# Patient Record
Sex: Female | Born: 1955 | Race: White | Hispanic: No | Smoking: Former smoker
Health system: Southern US, Community
[De-identification: ages and names within clinical notes are randomized; demographics above are authoritative.]

## PROBLEM LIST (undated history)

## (undated) DIAGNOSIS — K219 Gastro-esophageal reflux disease without esophagitis: Secondary | ICD-10-CM

## (undated) DIAGNOSIS — F319 Bipolar disorder, unspecified: Secondary | ICD-10-CM

## (undated) DIAGNOSIS — G459 Transient cerebral ischemic attack, unspecified: Secondary | ICD-10-CM

## (undated) DIAGNOSIS — K5900 Constipation, unspecified: Secondary | ICD-10-CM

## (undated) DIAGNOSIS — B999 Unspecified infectious disease: Secondary | ICD-10-CM

## (undated) DIAGNOSIS — G4700 Insomnia, unspecified: Secondary | ICD-10-CM

## (undated) DIAGNOSIS — D649 Anemia, unspecified: Secondary | ICD-10-CM

## (undated) DIAGNOSIS — J45909 Unspecified asthma, uncomplicated: Secondary | ICD-10-CM

## (undated) DIAGNOSIS — M1990 Unspecified osteoarthritis, unspecified site: Secondary | ICD-10-CM

## (undated) DIAGNOSIS — F0390 Unspecified dementia without behavioral disturbance: Secondary | ICD-10-CM

## (undated) DIAGNOSIS — J449 Chronic obstructive pulmonary disease, unspecified: Secondary | ICD-10-CM

## (undated) DIAGNOSIS — E119 Type 2 diabetes mellitus without complications: Secondary | ICD-10-CM

## (undated) DIAGNOSIS — T50901A Poisoning by unspecified drugs, medicaments and biological substances, accidental (unintentional), initial encounter: Secondary | ICD-10-CM

## (undated) HISTORY — DX: Unspecified asthma, uncomplicated: J45.909

## (undated) HISTORY — DX: Bipolar disorder, unspecified: F31.9

## (undated) HISTORY — PX: WISDOM TOOTH EXTRACTION: SHX21

## (undated) HISTORY — DX: Gastro-esophageal reflux disease without esophagitis: K21.9

## (undated) HISTORY — DX: Anemia, unspecified: D64.9

## (undated) HISTORY — PX: HIP SURGERY: SHX245

## (undated) HISTORY — DX: Type 2 diabetes mellitus without complications: E11.9

## (undated) HISTORY — DX: Unspecified dementia, unspecified severity, without behavioral disturbance, psychotic disturbance, mood disturbance, and anxiety: F03.90

## (undated) HISTORY — DX: Chronic obstructive pulmonary disease, unspecified: J44.9

## (undated) HISTORY — DX: Insomnia, unspecified: G47.00

## (undated) HISTORY — DX: Constipation, unspecified: K59.00

## (undated) HISTORY — DX: Unspecified osteoarthritis, unspecified site: M19.90

## (undated) HISTORY — DX: Transient cerebral ischemic attack, unspecified: G45.9

---
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
## 1997-08-22 HISTORY — PX: ABDOMINAL HYSTERECTOMY: SHX81

## 2015-12-28 ENCOUNTER — Ambulatory Visit: Payer: Self-pay | Admitting: Neurology

## 2015-12-29 ENCOUNTER — Ambulatory Visit: Payer: Self-pay | Admitting: Neurology

## 2016-07-05 ENCOUNTER — Other Ambulatory Visit (INDEPENDENT_AMBULATORY_CARE_PROVIDER_SITE_OTHER): Payer: BLUE CROSS/BLUE SHIELD

## 2016-07-05 ENCOUNTER — Encounter: Payer: Self-pay | Admitting: Internal Medicine

## 2016-07-05 ENCOUNTER — Ambulatory Visit (INDEPENDENT_AMBULATORY_CARE_PROVIDER_SITE_OTHER): Payer: BLUE CROSS/BLUE SHIELD | Admitting: Internal Medicine

## 2016-07-05 VITALS — BP 124/80 | HR 81 | Ht 65.0 in | Wt 175.0 lb

## 2016-07-05 DIAGNOSIS — R05 Cough: Secondary | ICD-10-CM

## 2016-07-05 DIAGNOSIS — R059 Cough, unspecified: Secondary | ICD-10-CM

## 2016-07-05 DIAGNOSIS — R058 Other specified cough: Secondary | ICD-10-CM | POA: Insufficient documentation

## 2016-07-05 DIAGNOSIS — J449 Chronic obstructive pulmonary disease, unspecified: Secondary | ICD-10-CM

## 2016-07-05 LAB — CBC WITH DIFFERENTIAL/PLATELET
BASOS ABS: 0 10*3/uL (ref 0.0–0.1)
Basophils Relative: 0.6 % (ref 0.0–3.0)
EOS PCT: 1.1 % (ref 0.0–5.0)
Eosinophils Absolute: 0.1 10*3/uL (ref 0.0–0.7)
HCT: 39.8 % (ref 36.0–46.0)
Hemoglobin: 13.3 g/dL (ref 12.0–15.0)
LYMPHS ABS: 1.5 10*3/uL (ref 0.7–4.0)
Lymphocytes Relative: 17.2 % (ref 12.0–46.0)
MCHC: 33.5 g/dL (ref 30.0–36.0)
MCV: 95.2 fl (ref 78.0–100.0)
MONOS PCT: 8.3 % (ref 3.0–12.0)
Monocytes Absolute: 0.7 10*3/uL (ref 0.1–1.0)
NEUTROS ABS: 6.2 10*3/uL (ref 1.4–7.7)
Neutrophils Relative %: 72.8 % (ref 43.0–77.0)
PLATELETS: 334 10*3/uL (ref 150.0–400.0)
RBC: 4.18 Mil/uL (ref 3.87–5.11)
RDW: 12.7 % (ref 11.5–15.5)
WBC: 8.6 10*3/uL (ref 4.0–10.5)

## 2016-07-05 LAB — NITRIC OXIDE: Nitric Oxide: 37

## 2016-07-05 MED ORDER — PANTOPRAZOLE SODIUM 40 MG PO TBEC: 40.0000 mg | DELAYED_RELEASE_TABLET | Freq: Every day | ORAL | 2 refills | Status: DC

## 2016-07-05 MED ORDER — FAMOTIDINE 20 MG PO TABS: ORAL_TABLET | ORAL | Status: DC

## 2016-07-05 NOTE — Progress Notes (Signed)
Subjective:     Patient ID: Kimberly Ellison, female   DOB: [birth year],    MRN: [id number]  HPI  [id number] yowf quit smoking 04/04/16 with a h/o albuterol dependency for cough >sob dating  Back mid 90's and no better since quit smoking so referred to pulmonary clinic 07/05/2016 by Dr   Boneta LucksJennifer Brown    07/05/2016 1st [facility] office visit/ Kegan Mckeithan   Chief Complaint  Patient presents with  . Pulmonary Consult    Referred by Dr. Charlies SilversJennifer Couillard. Pt c/o cough x 4 months.    dry cough x 20 years [place] fall [place] and worse than usual cough / need for albuterol since arrival  Cough worse at hs/ prilosec 20 mg daily after bfast/ once gets to sleep less likely to cough or wake in am coughing.  using lots of cough  Drops Mostly sob when coughing   No obvious day to day or daytime variability or assoc excess/ purulent sputum or mucus plugs or hemoptysis or cp or chest tightness, subjective wheeze or overt sinus or hb symptoms. No unusual exp hx or h/o childhood pna/ asthma or knowledge of premature birth.  Sleeping ok without nocturnal  or early am exacerbation  of respiratory  c/o's or need for noct saba. Also denies any obvious fluctuation of symptoms with weather or environmental changes or other aggravating or alleviating factors except as outlined above   Current Medications, Allergies, Complete Past Medical History, Past Surgical History, Family History, and Social History were reviewed in [facility] electronic medical record.  ROS  The following are not active complaints unless bolded sore throat, dysphagia, dental problems, itching, sneezing,  nasal congestion or excess/ purulent secretions, ear ache,   fever, chills, sweats, unintended wt loss, classically pleuritic or exertional cp,  orthopnea pnd or leg swelling, presyncope, palpitations, abdominal pain, anorexia, nausea, vomiting, diarrhea  or change in bowel or bladder habits, change in stools or urine,  dysuria,hematuria,  rash, arthralgias, visual complaints, headache, numbness, weakness or ataxia or problems with walking or coordination,  change in mood/affect or memory.            Review of Systems     Objective:   Physical Exam    amb wf vigorous throat clearing / prominent pseudowheeze supine    Wt Readings from Last 3 Encounters:  07/05/16 175 lb (79.4 kg)    Vital signs reviewed    .HEENT: nl dentition, turbinates, and oropharynx. Nl external ear canals without cough reflex   NECK :  without JVD/Nodes/TM/ nl carotid upstrokes bilaterally   LUNGS: no acc muscle use,  Nl contour chest which is clear to A and P bilaterally without cough on insp or exp maneuvers   CV:  RRR  no s3 or murmur or increase in P2, no edema   ABD:  soft and nontender with nl inspiratory excursion in the supine position. No bruits or organomegaly, bowel sounds nl  MS:  Nl gait/ ext warm without deformities, calf tenderness, cyanosis or clubbing No obvious joint restrictions   SKIN: warm and dry without lesions    NEURO:  alert, approp, nl sensorium with  no motor deficits    cxr reported nl 03/2016   Labs ordered 07/05/2016   Allergy profile / cbc with diff    Assessment:

## 2016-07-05 NOTE — Patient Instructions (Addendum)
Please see patient coordinator before you leave today  to schedule sinus ct   Instead of prilosec change to Protonix (pantoprazole) Take 30-60 min before first meal of the day and Pepcid 20 mg one bedtime plus chlorpheniramine 4 mg x 2 at bedtime (both available over the counter)  until cough is completely gone for at least a week without the need for cough suppression  GERD (REFLUX)  is an extremely common cause of respiratory symptoms, many times with no significant heartburn at all.    It can be treated with medication, but also with lifestyle changes including avoidance of late meals, excessive alcohol, smoking cessation, and avoid fatty foods, chocolate, peppermint, colas, red wine, and acidic juices such as orange juice.  NO MINT OR MENTHOL PRODUCTS SO NO COUGH DROPS   USE HARD CANDY INSTEAD (jolley ranchers or Stover's or Lifesavers (all available in sugarless versions) NO OIL BASED VITAMINS - use powdered substitutes.  Please remember to go to the lab   department downstairs for your tests - we will call you with the results when they are available.  Please schedule a follow up office visit in 2 weeks, sooner if needed later with all active medications in hand

## 2016-07-06 DIAGNOSIS — J4530 Mild persistent asthma, uncomplicated: Secondary | ICD-10-CM | POA: Insufficient documentation

## 2016-07-06 LAB — RESPIRATORY ALLERGY PROFILE REGION II ~~LOC~~
Allergen, A. alternata, m6: <0.1 kU/L
Allergen, C. Herbarum, M2: <0.1 kU/L
Allergen, Cedar tree, t12: <0.1 kU/L
Allergen, Comm Silver Birch, t9: <0.1 kU/L
Allergen, Cottonwood, t14: <0.1 kU/L
Allergen, Mouse Urine Protein, e78: <0.1 kU/L
Allergen, Mulberry, t76: <0.1 kU/L
Allergen, P. notatum, m1: <0.1 kU/L
Aspergillus fumigatus, m3: <0.1 kU/L
Bermuda Grass: <0.1 kU/L
Box Elder IgE: <0.1 kU/L
Cockroach: <0.1 kU/L
Common Ragweed: <0.1 kU/L
IgE (Immunoglobulin E), Serum: 11 kU/L (ref <115)
Johnson Grass: <0.1 kU/L
Rough Pigweed  IgE: <0.1 kU/L

## 2016-07-06 NOTE — Assessment & Plan Note (Addendum)
Allergy profile 07/05/2016 >  Eos 0.1 /  IgE   - FENO 07/05/2016  =   37  - Sinus CT 07/11/16 >>>   This is Upper airway cough syndrome (previously labeled PNDS) , is  so named because it's frequently impossible to sort out how much is  CR/sinusitis with freq throat clearing (which can be related to primary GERD)   vs  causing  secondary (" extra esophageal")  GERD from wide swings in gastric pressure that occur with throat clearing, often  promoting self use of mint and menthol lozenges that reduce the lower esophageal sphincter tone and exacerbate the problem further in a cyclical fashion.   These are the same pts (now being labeled as having "irritable larynx syndrome" by some cough centers) who not infrequently have a history of having failed to tolerate ace inhibitors,  dry powder inhalers or biphosphonates or report having atypical/extraesophageal reflux symptoms that don't respond to standard doses of PPI  and are easily confused as having aecopd or asthma flares by even experienced allergists/ pulmonologists (myself included).   Of the three most common causes of chronic cough, only one (GERD)  can actually cause the other two (asthma and post nasal drip syndrome)  and perpetuate the cylce of cough inducing airway trauma, inflammation, heightened sensitivity to reflux which is prompted by the cough itself via a cyclical mechanism.    This may partially respond to steroids and look like asthma and post nasal drainage but never erradicated completely unless the cough and the secondary reflux are eliminated, preferably both at the same time.  While not intuitively obvious, many patients with chronic low grade reflux do not cough until there is a secondary insult that disturbs the protective epithelial barrier and exposes sensitive nerve endings.  This can be viral or direct physical injury such as with an endotracheal tube.   The point is that once this occurs, it is difficult to eliminate using  anything but a maximally effective acid suppression regimen at least in the short run, accompanied by an appropriate diet to address non acid GERD.   For now max rx for GERD / w/u for allergy/sinus ct and regroup in 2 weeks

## 2016-07-06 NOTE — Assessment & Plan Note (Signed)
Quit smoking 03/2016 - Spirometry 07/05/2016  FEV1 1.67 (62%)  Ratio 61 s rx day of ov and classic curvature   For now more bothered by cough than sob and explained to pt that with such severe uacs she's unlikely to tolerated dpi's and even laba/ics may be problematic so focus on cough control first then when returns will add low dose ics/laba   Total time devoted to counseling  = 35/3477m review case with pt/ discussion of options/alternatives/ personally creating written instructions  in presence of pt  then going over those specific  Instructions directly with the pt including how to use all of the meds but in particular covering each new medication in detail and the difference between the maintenance/automatic meds and the prns using an action plan format for the latter.

## 2016-07-07 NOTE — Progress Notes (Signed)
Spoke with pt and notified of results per Dr. Wert. Pt verbalized understanding and denied any questions. 

## 2016-07-11 ENCOUNTER — Ambulatory Visit (INDEPENDENT_AMBULATORY_CARE_PROVIDER_SITE_OTHER)
Admission: RE | Admit: 2016-07-11 | Discharge: 2016-07-11 | Disposition: A | Discharge status: Home / Self Care | Payer: BLUE CROSS/BLUE SHIELD | Source: Ambulatory Visit | Attending: Internal Medicine | Admitting: Internal Medicine

## 2016-07-11 DIAGNOSIS — R05 Cough: Secondary | ICD-10-CM | POA: Diagnosis not present

## 2016-07-11 DIAGNOSIS — R059 Cough, unspecified: Secondary | ICD-10-CM

## 2016-07-12 ENCOUNTER — Telehealth: Payer: Self-pay | Admitting: Internal Medicine

## 2016-07-12 NOTE — Telephone Encounter (Signed)
Patient called back - she can be reached at [phone]

## 2016-07-12 NOTE — Telephone Encounter (Signed)
Notes Recorded by Nyoka CowdenMichael B Wert, MD on 07/11/2016 at 5:27 PM EST Call patient : Study is unremarkable, no change in recs ------------------------ Spoke with pt, aware of results/recs.  Nothing further needed.

## 2016-07-26 ENCOUNTER — Ambulatory Visit (INDEPENDENT_AMBULATORY_CARE_PROVIDER_SITE_OTHER): Payer: BLUE CROSS/BLUE SHIELD | Admitting: Internal Medicine

## 2016-07-26 ENCOUNTER — Encounter: Payer: Self-pay | Admitting: Internal Medicine

## 2016-07-26 VITALS — BP 120/80 | HR 83 | Ht 65.0 in | Wt 181.0 lb

## 2016-07-26 DIAGNOSIS — J449 Chronic obstructive pulmonary disease, unspecified: Secondary | ICD-10-CM | POA: Diagnosis not present

## 2016-07-26 DIAGNOSIS — R05 Cough: Secondary | ICD-10-CM

## 2016-07-26 DIAGNOSIS — R059 Cough, unspecified: Secondary | ICD-10-CM

## 2016-07-26 MED ORDER — TIOTROPIUM BROMIDE MONOHYDRATE 1.25 MCG/ACT IN AERS: 2.0000 | INHALATION_SPRAY | Freq: Every day | RESPIRATORY_TRACT | 3 refills | Status: DC

## 2016-07-26 MED ORDER — BUDESONIDE-FORMOTEROL FUMARATE 80-4.5 MCG/ACT IN AERO: INHALATION_SPRAY | RESPIRATORY_TRACT | 11 refills | Status: DC

## 2016-07-26 NOTE — Patient Instructions (Signed)
Plan A = Automatic = Symbicort 80 (dulera 100) Take 2 puffs first thing in am and then another 2 puffs about 12 hours later.   Work on inhaler technique:  relax and gently blow all the way out then take a nice smooth deep breath back in, triggering the inhaler at same time you start breathing in.  Hold for up to 5 seconds if you can. Blow out thru nose. Rinse and gargle with water when done  Do not change the acid suppression for now   Plan B = Backup Only use your albuterol(proair)  as a rescue medication to be used if you can't catch your breath by resting or doing a relaxed purse lip breathing pattern.  - The less you use it, the better it will work when you need it. - Ok to use the inhaler up to 2 puffs  every 4 hours if you must but call for appointment if use goes up over your usual need - Don't leave home without it !!  (think of it like the spare tire for your car)   Please schedule a follow up office visit in 6 weeks, call sooner if needed

## 2016-07-26 NOTE — Progress Notes (Signed)
Subjective:     Patient ID: [place] A Weyenberg, female   DOB: [birth year],    MRN: [id number]    Brief patient profile:  60 yowf quit smoking 04/04/16 with a h/o albuterol dependency for cough >sob dating  Back mid 90's and no better since quit smoking so referred to pulmonary clinic 07/05/2016 by Dr   Boneta LucksJennifer Brown      History of Present Illness  07/05/2016 1st [facility] office visit/ Princella Jaskiewicz   Chief Complaint  Patient presents with  . Pulmonary Consult    Referred by Dr. Charlies SilversJennifer Couillard. Pt c/o cough x 4 months.    dry cough x 20 years moved [place] fall [place] and worse than usual cough / need for albuterol since arrival  Cough worse at hs/ prilosec 20 mg daily after bfast/ once gets to sleep less likely to cough or wake in am coughing.  using lots of cough  Drops Mostly sob when coughing  rec   schedule sinus ct > neg   Instead of prilosec change to Protonix (pantoprazole) Take 30-60 min before first meal of the day and Pepcid 20 mg one bedtime plus chlorpheniramine 4 mg x 2 at bedtime (both available over the counter)  until cough is completely gone for at least a week without the need for cough suppression GERD diet   Please schedule a follow up office visit in 2 weeks, sooner if needed later with all active medications in hand    07/26/2016  f/u ov/Nayra Coury re: ASTHMA since birth, 2 saba  inhalers per month on max gerd rx / no meds  Chief Complaint  Patient presents with  . Follow-up    2 week follow up. Pt. states the coughing pills are helping. She has not coughed in a month.    felt needed saba w/in one hour of ov   No obvious day to day or daytime variability or assoc excess/ purulent sputum or mucus plugs or hemoptysis or cp or chest tightness, subjective wheeze or overt sinus or hb symptoms. No unusual exp hx or h/o childhood pna/ asthma or knowledge of premature birth.  Sleeping ok without nocturnal  or early am exacerbation  of respiratory  c/o's or need  for noct saba. Also denies any obvious fluctuation of symptoms with weather or environmental changes or other aggravating or alleviating factors except as outlined above   Current Medications, Allergies, Complete Past Medical History, Past Surgical History, Family History, and Social History were reviewed in [facility] electronic medical record.  ROS  The following are not active complaints unless bolded sore throat, dysphagia, dental problems, itching, sneezing,  nasal congestion or excess/ purulent secretions, ear ache,   fever, chills, sweats, unintended wt loss, classically pleuritic or exertional cp,  orthopnea pnd or leg swelling, presyncope, palpitations, abdominal pain, anorexia, nausea, vomiting, diarrhea  or change in bowel or bladder habits, change in stools or urine, dysuria,hematuria,  rash, arthralgias, visual complaints, headache, numbness, weakness or ataxia or problems with walking or coordination,  change in mood/affect or memory.              Objective:   Physical Exam  amb wf nad    Wt Readings from Last 3 Encounters:  07/26/16 181 lb (82.1 kg)  07/05/16 175 lb (79.4 kg)    Vital signs reviewed - Note on arrival 02 sats  97% on RA     HEENT: nl   turbinates, and oropharynx. Nl external ear canals without  cough reflex - full dentures    NECK :  without JVD/Nodes/TM/ nl carotid upstrokes bilaterally   LUNGS: no acc muscle use,  Nl contour chest which is clear to A and P bilaterally without cough on insp or exp maneuvers   CV:  RRR  no s3 or murmur or increase in P2, no edema   ABD:  soft and nontender with nl inspiratory excursion in the supine position. No bruits or organomegaly, bowel sounds nl  MS:  Nl gait/ ext warm without deformities, calf tenderness, cyanosis or clubbing No obvious joint restrictions   SKIN: warm and dry without lesions    NEURO:  alert, approp, nl sensorium with  no motor deficits          Assessment:

## 2016-07-27 NOTE — Assessment & Plan Note (Signed)
Allergy profile 07/05/2016 >  Eos 0.1 /  IgE 11 neg RAST  - FENO 07/05/2016  =   37  - Sinus CT 07/11/2016  : Paranasal sinuses clear. -  07/26/2016  Improved on rx for cough   Adequate control on present rx, reviewed in detail with pt > no change in rx needed

## 2016-07-27 NOTE — Assessment & Plan Note (Addendum)
Quit smoking 03/2016 - Spirometry 07/05/2016  FEV1 1.67 (62%)  Ratio 61 s rx day of ov and classic curvature   - Spirometry 07/26/2016  FEV1 1.76 (65%)  Ratio 71 with minimal curvature    - The proper method of use, as well as anticipated side effects, of a metered-dose inhaler are discussed and demonstrated to the patient. Improved effectiveness after extensive coaching during this visit to a level of approximately 75 % from a baseline of 50 %     Cough is better with gerd rx but not perceived need for saba suggesting poor control of asthma  In this case Adherence is the biggest issue and starts with  inability to use HFA effectively and also  understand that SABA treats the symptoms but doesn't get to the underlying problem (inflammation).  I used  the analogy of putting steroid cream on a rash to help explain the meaning of topical therapy and the need to get the drug to the target tissue.    rec trial of symb 80 2bid and change saba to true rescue rx - avoid higher doses of ICS and all powders due to cough tendency   I had an extended discussion with the patient reviewing all relevant studies completed to date and  lasting 15 to 20 minutes of a 25 minute visit    Each maintenance medication was reviewed in detail including most importantly the difference between maintenance and prns and under what circumstances the prns are to be triggered using an action plan format that is not reflected in the computer generated alphabetically organized AVS.    Please see AVS for unique instructions that I personally wrote and verbalized to the the pt in detail and then reviewed with pt  by my nurse highlighting any  changes in therapy recommended at today's visit to their plan of care.

## 2016-09-06 ENCOUNTER — Ambulatory Visit (INDEPENDENT_AMBULATORY_CARE_PROVIDER_SITE_OTHER): Payer: BLUE CROSS/BLUE SHIELD | Admitting: Internal Medicine

## 2016-09-06 ENCOUNTER — Encounter: Payer: Self-pay | Admitting: Internal Medicine

## 2016-09-06 VITALS — BP 112/80 | HR 75 | Ht 65.0 in | Wt 183.9 lb

## 2016-09-06 DIAGNOSIS — R05 Cough: Secondary | ICD-10-CM

## 2016-09-06 DIAGNOSIS — R058 Other specified cough: Secondary | ICD-10-CM

## 2016-09-06 DIAGNOSIS — J449 Chronic obstructive pulmonary disease, unspecified: Secondary | ICD-10-CM | POA: Diagnosis not present

## 2016-09-06 MED ORDER — BUDESONIDE-FORMOTEROL FUMARATE 80-4.5 MCG/ACT IN AERO: 2.0000 | INHALATION_SPRAY | Freq: Two times a day (BID) | RESPIRATORY_TRACT | 0 refills | Status: DC

## 2016-09-06 MED ORDER — BUDESONIDE-FORMOTEROL FUMARATE 80-4.5 MCG/ACT IN AERO: 2.0000 | INHALATION_SPRAY | Freq: Two times a day (BID) | RESPIRATORY_TRACT | 11 refills | Status: DC

## 2016-09-06 NOTE — Patient Instructions (Addendum)
Plan A = Automatic = Symbicort 80 Take 2 puffs first thing in am and then another 2 puffs about 12 hours later.  Also continue protonix (pantoprazole ) 40 mg Take 30-60 min before first meal of the day or Prilosec over the counter plus pepcid 20 mg at bedtime (also over the counter  For drainage / throat tickle try take CHLORPHENIRAMINE  4 mg - take one every 4 hours as needed - available over the counter- may cause drowsiness so start with just a bedtime dose or two and see how you tolerate it before trying in daytime    Work on inhaler technique:  relax and gently blow all the way out then take a nice smooth deep breath back in, triggering the inhaler at same time you start breathing in.  Hold for up to 5 seconds if you can. Blow out thru nose. Rinse and gargle with water when done     Plan B = Backup Only use your albuterol (PROAIR) as a rescue medication to be used if you can't catch your breath by resting or doing a relaxed purse lip breathing pattern.  - The less you use it, the better it will work when you need it. - Ok to use the inhaler up to 2 puffs  every 4 hours if you must but call for appointment if use goes up over your usual need - Don't leave home without it !!  (think of it like the spare tire for your car)    Please schedule a follow up office visit in 6 weeks, call sooner if needed with pfts first  - don't use your proair w/in 4 hours of test and we will give it to you here

## 2016-09-06 NOTE — Progress Notes (Signed)
Subjective:     Patient ID: Kimberly Ellison, female   DOB: June 21, 1956,    MRN: [id number]    Brief patient profile:  60 yowf quit smoking 04/04/16 with a h/o albuterol dependency for cough >sob dating  Back mid 90's and no better since quit smoking so referred to pulmonary clinic 07/05/2016 by Dr   Boneta Lucks      History of Present Illness  07/05/2016 1st [facility] office visit/ Kimberly Ellison   Chief Complaint  Patient presents with  . Pulmonary Consult    Referred by Dr. Charlies Silvers. Pt c/o cough x 4 months.    dry cough x 20 years moved to [place] from [place] fall 2016 and worse than usual cough / need for albuterol since arrival  Cough worse at hs/ prilosec 20 mg daily after bfast/ once gets to sleep less likely to cough or wake in am coughing.  using lots of cough  Drops Mostly sob when coughing  rec   schedule sinus ct > neg   Instead of prilosec change to Protonix (pantoprazole) Take 30-60 min before first meal of the day and Pepcid 20 mg one bedtime plus chlorpheniramine 4 mg x 2 at bedtime (both available over the counter)  until cough is completely gone for at least a week without the need for cough suppression GERD diet  Please schedule a follow up office visit in 2 weeks, sooner if needed later with all active medications in hand     07/26/2016  f/u ov/Kimberly Ellison re: ASTHMA since birth, 2 saba  inhalers per month on max gerd rx / no meds in hand Chief Complaint  Patient presents with  . Follow-up    2 week follow up. Pt. states the coughing pills are helping. She has not coughed in a month.    felt needed saba w/in one hour of ov  rec Plan A = Automatic = Symbicort 80 (dulera 100) Take 2 puffs first thing in am and then another 2 puffs about 12 hours later.  Work on inhaler technique:  relax and gently blow all the way out then take a nice smooth deep breath back in, triggering the inhaler at same time you start breathing in.  Hold for up to 5 seconds if you can. Blow  out thru nose. Rinse and gargle with water when done Do not change the acid suppression for now  Plan B = Backup Only use your albuterol(proair)  as a rescue medication      09/06/2016  f/u ov/Kimberly Ellison re:   Copd vs asthma confused with meds / maintt symb 80 2bid/ ppi/ hs hs/ again did not bring meds as req Chief Complaint  Patient presents with  . Follow-up    Pt stated her COPD is stable. Pt stated her cough has improved drastically and is very minimal at this time   still overusing saba/ confused re purpose of symb 80/ really no significant cough since last ov thoroughly confused with instructions Symptoms are daytime only never noct   No obvious day to day or daytime variability or assoc excess/ purulent sputum or mucus plugs or hemoptysis or cp or chest tightness, subjective wheeze or overt sinus or hb symptoms. No unusual exp hx or h/o childhood pna/ asthma or knowledge of premature birth.  Sleeping ok flat  without nocturnal  or early am exacerbation  of respiratory  c/o's or need for noct saba. Also denies any obvious fluctuation of symptoms with weather or environmental changes or  other aggravating or alleviating factors except as outlined above   Current Medications, Allergies, Complete Past Medical History, Past Surgical History, Family History, and Social History were reviewed in [facility] electronic medical record.  ROS  The following are not active complaints unless bolded sore throat, dysphagia, dental problems, itching, sneezing,  nasal congestion or excess/ purulent secretions, ear ache,   fever, chills, sweats, unintended wt loss, classically pleuritic or exertional cp,  orthopnea pnd or leg swelling, presyncope, palpitations, abdominal pain, anorexia, nausea, vomiting, diarrhea  or change in bowel or bladder habits, change in stools or urine, dysuria,hematuria,  rash, arthralgias, visual complaints, headache, numbness, weakness or ataxia or problems with walking or  coordination,  change in mood/affect or memory.              Objective:   Physical Exam  amb wf nad rarely throat clearing     09/06/2016       183   07/26/16 181 lb (82.1 kg)  07/05/16 175 lb (79.4 kg)    Vital signs reviewed - Note on arrival 02 sats  98% on RA     HEENT: nl   turbinates, and oropharynx. Nl external ear canals without cough reflex - full dentures    NECK :  without JVD/Nodes/TM/ nl carotid upstrokes bilaterally   LUNGS: no acc muscle use,  Nl contour chest which is clear to A and P bilaterally without cough on insp or exp maneuvers   CV:  RRR  no s3 or murmur or increase in P2, no edema   ABD:  soft and nontender with nl inspiratory excursion in the supine position. No bruits or organomegaly, bowel sounds nl  MS:  Nl gait/ ext warm without deformities, calf tenderness, cyanosis or clubbing No obvious joint restrictions   SKIN: warm and dry without lesions    NEURO:  alert, approp, nl sensorium with  no motor deficits        Labs ordered 09/06/2016  Allergy profile   Assessment:

## 2016-09-07 NOTE — Assessment & Plan Note (Addendum)
Allergy profile 07/05/2016 >  Eos 0.1 /  IgE 11 neg RAST  - FENO 07/05/2016  =   37  - Sinus CT 07/11/2016  : Paranasal sinuses clear. -  07/26/2016  Improved on rx for cough      Upper airway cough syndrome (previously labeled PNDS) , is  so named because it's frequently impossible to sort out how much is  CR/sinusitis with freq throat clearing (which can be related to primary GERD)   vs  causing  secondary (" extra esophageal")  GERD from wide swings in gastric pressure that occur with throat clearing, often  promoting self use of mint and menthol lozenges that reduce the lower esophageal sphincter tone and exacerbate the problem further in a cyclical fashion.   These are the same pts (now being labeled as having "irritable larynx syndrome" by some cough centers) who not infrequently have a history of having failed to tolerate ace inhibitors,  dry powder inhalers or biphosphonates or report having atypical/extraesophageal reflux symptoms that don't respond to standard doses of PPI  and are easily confused as having aecopd or asthma flares by even experienced allergists/ pulmonologists (myself included).   This has improved on rx for gerd, still clearing throat though > reminded re use of 1st gen per guidelines. see avs for instructions unique to this ov

## 2016-09-07 NOTE — Assessment & Plan Note (Addendum)
Quit smoking 03/2016 - Spirometry 07/05/2016  FEV1 1.67 (62%)  Ratio 61 s rx day of ov and classic curvature   - Spirometry 07/26/2016  FEV1 1.76 (65%)  Ratio 71 with minimal curvature   - 07/26/2016  > try symbicort 80 2bid  - 09/06/2016  After extensive coaching HFA effectiveness =    90% > continue symb 80 2bid  Symptoms are markedly disproportionate to objective findings and not clear this is a lung problem but pt does appear to have difficult airway management issues. DDX of  difficult airways management almost all start with A and  include Adherence, Ace Inhibitors, Acid Reflux, Active Sinus Disease, Alpha 1 Antitripsin deficiency, Anxiety masquerading as Airways dz,  ABPA,  Allergy(esp in young), Aspiration (esp in elderly), Adverse effects of meds,  Active smokers, A bunch of PE's (a small clot burden can't cause this syndrome unless there is already severe underlying pulm or vascular dz with poor reserve) plus two Bs  = Bronchiectasis and Beta blocker use..and one C= CHF  Adherence is always the initial "prime suspect" and is a multilayered concern that requires a "trust but verify" approach in every patient - starting with knowing how to use medications, especially inhalers, correctly, keeping up with refills and understanding the fundamental difference between maintenance and prns vs those medications only taken for a very short course and then stopped and not refilled.  - thoroughly confused with meds- needs to return with all meds in hand using a trust but verify approach to confirm accurate Medication  Reconciliation The principal here is that until we are certain that the  patients are doing what we've asked, it makes no sense to ask them to do more.  - good hfa technique so continue symb hfa 80 2bid   ? Acid (or non-acid) GERD > always difficult to exclude as up to 75% of pts in some series report no assoc GI/ Heartburn symptoms> rec continue max (24h)  acid suppression and diet  restrictions/ reviewed     ? Anxiety/depression > usually at the bottom of this list of usual suspects but should be much higher on this pt's based on H and P and may be interfering with compliance issues / over use of saba    ? Allergy / asthma > neg feno and allergy profile/ neg assoc rhinitis or noct symptoms rules against, so no titration of ics needed  ? chf > no evidence to support and note absence of any noct component   I had an extended discussion with the patient reviewing all relevant studies completed to date and  lasting 15 to 20 minutes of a 25 minute visit    Each maintenance medication was reviewed in detail including most importantly the difference between maintenance and prns and under what circumstances the prns are to be triggered using an action plan format that is not reflected in the computer generated alphabetically organized AVS.    Please see AVS for specific instructions unique to this visit that I personally wrote and verbalized to the the pt in detail and then reviewed with pt  by my nurse highlighting any  changes in therapy recommended at today's visit to their plan of care.

## 2016-10-18 ENCOUNTER — Ambulatory Visit: Payer: BLUE CROSS/BLUE SHIELD | Admitting: Internal Medicine

## 2017-05-11 ENCOUNTER — Encounter: Payer: Self-pay | Admitting: *Deleted

## 2017-05-12 ENCOUNTER — Ambulatory Visit (INDEPENDENT_AMBULATORY_CARE_PROVIDER_SITE_OTHER): Payer: BLUE CROSS/BLUE SHIELD | Admitting: Neurology

## 2017-05-12 ENCOUNTER — Encounter: Payer: Self-pay | Admitting: Neurology

## 2017-05-12 ENCOUNTER — Encounter: Payer: Self-pay | Admitting: Psychology

## 2017-05-12 VITALS — BP 132/79 | HR 72 | Ht 64.5 in | Wt 181.0 lb

## 2017-05-12 DIAGNOSIS — R413 Other amnesia: Secondary | ICD-10-CM | POA: Diagnosis not present

## 2017-05-12 DIAGNOSIS — R419 Unspecified symptoms and signs involving cognitive functions and awareness: Secondary | ICD-10-CM

## 2017-05-12 NOTE — Progress Notes (Addendum)
[facility]    Provider:  Dr Lucia Gaskins Referring Provider: Charlies Silvers, Voula* Primary Care Physician:  Charlies Silvers, PA-C  CC:  Memory difficulty  HPI:  Kimberly Ellison is a 61 y.o. female here as a referral from Dr. Tereso Newcomer for memory problems. Past medical history of bipolar disorder on Lamictal, cocaine addiction, depression, diabetes, anxiety, insomnia, marijuana dependence, suicidal ideation, TIA, COPD, HLD, previous smoker. She is here with daughter who provides much information. She divorced 8 years ago and moved in with a relative. 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 Mother passed away 2 years ago and patient moved to this area, they had to have a calendar and keep notes (aunt helped patient). More short term memory, dates, names, recent conversations, repeats things she says and asks the same questions in the same day multiple times over and over in the same day. 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 She can't manage the printer or put paper in it, forgets daily. Slowly progresses but waxes and wanes as well. Patient forgot her son-in-law's birthday and had enough, thought it was time to be evaluated because everyone has told her for years that she is having memory problems. Father had dementia died at 22 and had dementia at least 6 years prior to death, he was a smoker but no known use of substance abuse in her family. Mother developed memory loss in her 4s. Patient has blank stares, alteration of awareness.  She does not respond initially. Patient is completely aware when she is staring per patient, she just doesn't doesn't answer. No hx of seizure. She is on medication for bipolar disorder and says she is well controlled but daughter believes she has more depression than she says she does, she has manic states and most recently this past July called daughter crying saying she couldn't handle things and there is major anxiety and stress ongoing.   Reviewed notes, labs and imaging from outside physicians, which showed:  Reviewed primary care notes and notes from [facility]. She presented with memory issues. Becoming more forgetful for patient. That short-term memory for several years per daughter. Daughter states patient is worried about getting lost in developing memory issues. She has to tell the patient several times how to put paper copy her work and had to do other things at work. Mostly short-term memory. Patient's mother and father both had dementia. Patient also gets a blank stare and her face and doesn't remember anything happening. She drinks 2-4 L of Diet Coke every day. She is treated by psychiatry for bipolar disorder. She performs all her own ADLs and drives just worried about getting lost. MMSE was 27 out of 30 05/09/2017.     Review of Systems: Patient complains of symptoms per HPI as well as the following symptoms: Increased thirst, joint pain, shortness of breath, cough, wheezing, snoring, hearing loss. Pertinent negatives and positives per HPI. All others negative.   Social History   Social History  . Marital status: Divorced    Spouse name:  N/A  . Number of children: 1  . Years of education: 10   Occupational History  .      [employer]   Social History Main Topics  . Smoking status: Former Smoker    Packs/day: 2.00    Years: 46.00    Types: Cigarettes    Quit date: 04/04/2016  . Smokeless tobacco: Never Used  . Alcohol use No  . Drug use: No     Comment: hx cocaine,  marijuana   . Sexual activity: Not on file   Other Topics Concern  . Not on file   Social History Narrative   Lives alone   Drinks 2-4 liters Diet Coke daily    Family History  Problem Relation Age of Onset  . Lymphoma Mother   . Hypertension Mother   . Stroke Mother   . Hypertension Sister   . Diabetes Sister   . Diabetes Daughter     Past Medical History:  Diagnosis Date  . Asthma   . Bipolar 1 disorder (HCC)   . COPD (chronic obstructive pulmonary disease) (HCC)   . Diabetes (HCC)   . DJD (degenerative joint disease)   . GERD (gastroesophageal reflux disease)   . Insomnia   . Transient cerebral ischemia     Past Surgical History:  Procedure Laterality Date  . ABDOMINAL HYSTERECTOMY  1999  . WISDOM TOOTH EXTRACTION      Current Outpatient Prescriptions  Medication Sig Dispense Refill  . aspirin EC 81 MG tablet Take 81 mg by mouth daily.    Marland Kitchen atorvastatin (LIPITOR) 10 MG tablet 10 mg daily.  0  . budesonide-formoterol (SYMBICORT) 80-4.5 MCG/ACT inhaler Inhale 2 puffs into the lungs 2 (two) times daily. 1 Inhaler 0  . IRON PO Take 1 tablet by mouth daily.    Marland Kitchen lamoTRIgine (LAMICTAL) 100 MG tablet Take 100 mg by mouth daily.    Marland Kitchen OLANZapine (ZYPREXA) 20 MG tablet Take 20 mg by mouth 2 (two) times daily.    . temazepam (RESTORIL) 30 MG capsule 30 mg at bedtime.  0   No current facility-administered medications for this visit.     Allergies as of 05/12/2017 - Review Complete 05/12/2017  Allergen Reaction Noted  . Penicillins Hives 07/05/2016    Vitals: BP 132/79   Pulse 72   Ht 5' 4.5" (1.638 m)   Wt 181 lb (82.1 kg)   BMI 30.59 kg/m  Last Weight:  Wt Readings from Last 1 Encounters:  05/12/17 181 lb (82.1 kg)   Last Height:   Ht Readings from Last 1 Encounters:  05/12/17 5' 4.5" (1.638 m)  Physical exam: Exam: Gen: NAD, conversant, well nourised, obese, well groomed                     CV: RRR, no MRG. No Carotid Bruits. No peripheral edema,  warm, nontender Eyes: Conjunctivae clear without exudates or hemorrhage  Neuro: Detailed Neurologic Exam  Speech:    Speech is normal; fluent and spontaneous with normal comprehension.  Cognition:  MMSE - Mini Mental State Exam 05/12/2017  Orientation to time 3  Orientation to Place 3  Registration 3  Attention/ Calculation 5  Recall 1  Language- name 2 objects 2  Language- repeat 0  Language- follow 3 step command 3  Language- read & follow direction 1  Write a sentence 1  Copy design 0  Total score 22      The patient is oriented to person, place,  and time;     recent and remote memory intact;     language fluent;     Impaired attention, concentration, fund of knowledge Cranial Nerves:    The pupils are equal, round, and reactive to light. Attempted fundoscopic exam could not visualize. . Visual fields are full to finger confrontation. Extraocular movements are intact. Trigeminal sensation is intact and the muscles of mastication are normal. The face is symmetric. The palate elevates in the midline. Hearing intact. Voice is normal. Shoulder shrug is normal. The tongue has normal motion without fasciculations.   Coordination:    Normal finger to nose and heel to shin. Normal rapid alternating movements.   Gait:    Heel-toe and tandem gait are normal.   Motor Observation:    No asymmetry, no atrophy, and no involuntary movements noted. Tone:    Normal muscle tone.    Posture:    Posture is normal. normal erect    Strength:    Strength is V/V in the upper and lower limbs.      Sensation: intact to LT     Reflex Exam:  DTR's:    Deep tendon reflexes in the upper and lower extremities are normal bilaterally.   Toes:    The toes are downgoing bilaterally.   Clonus:    Clonus is absent.        Assessment/Plan:  61 year old with memory loss, more short-term, progressive. FHx of dementia in both parents. Complicated by a history of substance abuse including  cocaine, marijuana, alcohol (15 years ago) and more recent medication abuse of Tramadol. No alcohol use, maybe an ocassional drink when they eat. No medication overuse for one year. She lives with daughter and works at [employer] so she is monitored. Last substance abuse was a year ago.  Also concern about altered consciousness due to staring spells, does not correlate with seizures but need to evaluate for seizure focus with MRI brain and EEG as weall as for memory problems to eval for reversible causes of symptoms. Also formal neurocognitive testing would be helpful here. Recommended f/u with psychiatry and surgery for psychiatric illnesses. Will check labs such as B12 and tsh as these can cause neuropsychiatric problems. Will check lamictal level for long term med use.  Patient is unable to drive, operate heavy machinery, perform activities at heights or participate in water activities until 6 months seizure free  Addendum: discussed lacunar infarct with patient and daughter. Discussed with patient and daughter. ASA  for secondary stroke prevention and maintain strict control of hypertension with blood pressure goal below 130/90, diabetes with hemoglobin A1c goal below 6.5% and lipids with LDL cholesterol goal below 70 mg/dL I also advised the patient to eat a healthy diet with plenty of whole grains, cereals, fruits and vegetables, exercise regularly and maintain ideal body weight .Followup in the future with me after formal neurocognitive testing and also ordered cognitive therapy.   Orders Placed This Encounter  Procedures  . MR BRAIN W WO CONTRAST  . B12 and Folate Panel  . Methylmalonic acid, serum  . RPR  . HIV antibody  . Homocysteine  . TSH  . Comprehensive metabolic panel  . CBC  . Lamotrigine level  . Ambulatory referral to Neuropsychology  . EEG   Cc: Couillard, Victorino Dike, PA-C  Naomie Dean, MD  [facility] [address] [place], [place] [address]  Phone [phone] Fax [phone]

## 2017-05-12 NOTE — Patient Instructions (Addendum)
Remember to drink plenty of fluid, eat healthy meals and do not skip any meals. Try to eat protein with a every meal and eat a healthy snack such as fruit or nuts in between meals. Try to keep a regular sleep-wake schedule and try to exercise daily, particularly in the form of walking, 20-30 minutes a day, if you can.   As far as diagnostic testing: MRI brain, labs, EEG, formal neuropsychiatric testing with Dr. Dimas Chyle  I would like to see you back 4 months, sooner if we need to. Please call us with any interim questions, concerns, problems, updates or refill requests.   Our phone number is [phone]. We also have an after hours call service for urgent matters and there is a physician on-call for urgent questions. For any emergencies you know to call 911 or go to the nearest emergency room   Seizure, Adult A seizure is a sudden burst of abnormal electrical activity in the brain. The abnormal activity temporarily interrupts normal brain function, causing a person to experience any of the following:  Involuntary movements.  Changes in awareness or consciousness.  Uncontrollable shaking (convulsions).  Seizures usually last from 30 seconds to 2 minutes. They usually do not cause permanent brain damage unless they are prolonged. What can cause a seizure to happen? Seizures can happen for many reasons including:  A fever.  Low blood sugar.  A medicine.  An illnesses.  A brain injury.  Some people who have a seizure never have another one. People who have repeated seizures have a condition called epilepsy. What are the symptoms of a seizure? Symptoms of a seizure vary greatly from person to person. They include:  Convulsions.  Stiffening of the body.  Involuntary movements of the arms or legs.  Loss of consciousness.  Breathing problems.  Falling suddenly.  Confusion.  Head nodding.  Eye blinking or fluttering.  Lip smacking.  Drooling.  Rapid eye  movements.  Grunting.  Loss of bladder control and bowel control.  Staring.  Unresponsiveness.  Some people have symptoms right before a seizure happens (aura) and right after a seizure happens. Symptoms of an aura include:  Fear or anxiety.  Nausea.  Feeling like the room is spinning (vertigo).  A feeling of having seen or heard something before (deja vu).  Odd tastes or smells.  Changes in vision, such as seeing flashing lights or spots.  Symptoms that may follow a seizure include:  Confusion.  Sleepiness.  Headache.  Weakness of one side of the body.  Follow these instructions at home: Medicines   Take over-the-counter and prescription medicines only as told by your health care provider.  Avoid any substances that may prevent your medicine from working properly, such as alcohol. Activity  Do not drive, swim, or do any other activities that would be dangerous if you had another seizure. Wait until your health care provider approves.  If you live in the [place] check with your local DMV (department of motor vehicles) to find out about the local driving laws. Each state has specific rules about when you can legally return to driving.  Get enough rest. Lack of sleep can make seizures more likely to occur. Educating others Teach friends and family what to do if you have a seizure. They should:  Lay you on the ground to prevent a fall.  Cushion your head and body.  Loosen any tight clothing around your neck.  Turn you on your side. If vomiting occurs, this helps  keep your airway clear.  Stay with you until you recover.  Not hold you down. Holding you down will not stop the seizure.  Not put anything in your mouth.  Know whether or not you need emergency care.  General instructions  Contact your health care provider each time you have a seizure.  Avoid anything that has ever triggered a seizure for you.  Keep a seizure diary. Record what you remember  about each seizure, especially anything that might have triggered the seizure.  Keep all follow-up visits as told by your health care provider. This is important. Contact a health care provider if:  You have another seizure.  You have seizures more often.  Your seizure symptoms change.  You continue to have seizures with treatment.  You have symptoms of an infection or illness. They might increase your risk of having a seizure. Get help right away if:  You have a seizure: ? That lasts longer than 5 minutes. ? That is different than previous seizures. ? That leaves you unable to speak or use a part of your body. ? That makes it harder to breathe. ? After a head injury.  You have: ? Multiple seizures in a row. ? Confusion or a severe headache right after a seizure.  You are having seizures more often.  You do not wake up immediately after a seizure.  You injure yourself during a seizure. These symptoms may represent a serious problem that is an emergency. Do not wait to see if the symptoms will go away. Get medical help right away. Call your local emergency services (911 in the [place] Do not drive yourself to the hospital. This information is not intended to replace advice given to you by your health care provider. Make sure you discuss any questions you have with your health care provider. Document Released: 08/05/2000 Document Revised: 04/03/2016 Document Reviewed: 03/11/2016 Elsevier Interactive Patient Education  2017 [employer].

## 2017-05-14 DIAGNOSIS — R419 Unspecified symptoms and signs involving cognitive functions and awareness: Secondary | ICD-10-CM | POA: Insufficient documentation

## 2017-05-17 LAB — COMPREHENSIVE METABOLIC PANEL
A/G RATIO: 2.4 — AB (ref 1.2–2.2)
ALBUMIN: 4.6 g/dL (ref 3.5–5.5)
ALT: 8 IU/L (ref 0–32)
AST: 14 IU/L (ref 0–40)
Alkaline Phosphatase: 113 IU/L (ref 39–117)
BILIRUBIN TOTAL: 0.2 mg/dL (ref 0.0–1.2)
BUN / CREAT RATIO: 8 — AB (ref 9–23)
BUN: 7 mg/dL (ref 6–24)
CHLORIDE: 98 mmol/L (ref 96–106)
CO2: 22 mmol/L (ref 20–29)
Calcium: 9.4 mg/dL (ref 8.7–10.2)
Creatinine, Ser: 0.87 mg/dL (ref 0.57–1.00)
GFR calc Af Amer: 84 mL/min/{1.73_m2} (ref >59)
GFR calc non Af Amer: 73 mL/min/{1.73_m2} (ref >59)
GLOBULIN, TOTAL: 1.9 g/dL (ref 1.5–4.5)
Glucose: 86 mg/dL (ref 65–99)
POTASSIUM: 4.8 mmol/L (ref 3.5–5.2)
Sodium: 134 mmol/L (ref 134–144)
Total Protein: 6.5 g/dL (ref 6.0–8.5)

## 2017-05-17 LAB — RPR: RPR: NONREACTIVE

## 2017-05-17 LAB — METHYLMALONIC ACID, SERUM: Methylmalonic Acid: 173 nmol/L (ref 0–378)

## 2017-05-17 LAB — B12 AND FOLATE PANEL
Folate: 13.3 ng/mL (ref >3.0)
Vitamin B-12: 406 pg/mL (ref 232–1245)

## 2017-05-17 LAB — CBC
HEMATOCRIT: 38.7 % (ref 34.0–46.6)
Hemoglobin: 12.7 g/dL (ref 11.1–15.9)
MCH: 30.5 pg (ref 26.6–33.0)
MCHC: 32.8 g/dL (ref 31.5–35.7)
MCV: 93 fL (ref 79–97)
Platelets: 293 10*3/uL (ref 150–379)
RBC: 4.17 x10E6/uL (ref 3.77–5.28)
RDW: 13.5 % (ref 12.3–15.4)
WBC: 6.8 10*3/uL (ref 3.4–10.8)

## 2017-05-17 LAB — HIV ANTIBODY (ROUTINE TESTING W REFLEX): HIV Screen 4th Generation wRfx: NONREACTIVE

## 2017-05-17 LAB — LAMOTRIGINE LEVEL: Lamotrigine Lvl: 2 ug/mL (ref 2.0–20.0)

## 2017-05-17 LAB — HOMOCYSTEINE: Homocysteine: 8.6 umol/L (ref 0.0–15.0)

## 2017-05-17 LAB — TSH: TSH: 2.69 u[IU]/mL (ref 0.450–4.500)

## 2017-05-24 ENCOUNTER — Telehealth: Payer: Self-pay

## 2017-05-24 ENCOUNTER — Ambulatory Visit (INDEPENDENT_AMBULATORY_CARE_PROVIDER_SITE_OTHER): Payer: BLUE CROSS/BLUE SHIELD | Admitting: Neurology

## 2017-05-24 DIAGNOSIS — R419 Unspecified symptoms and signs involving cognitive functions and awareness: Secondary | ICD-10-CM

## 2017-05-24 DIAGNOSIS — R413 Other amnesia: Secondary | ICD-10-CM

## 2017-05-24 DIAGNOSIS — R410 Disorientation, unspecified: Secondary | ICD-10-CM | POA: Diagnosis not present

## 2017-05-24 NOTE — Telephone Encounter (Signed)
I spoke with patient and made her aware that the EEG showed no seizure-like activity. She voiced understanding and is aware to keep her follow up on 09/11/16.

## 2017-05-24 NOTE — Telephone Encounter (Signed)
-----   Message from Geronimo Running, RN sent at 05/24/2017  2:28 PM EDT -----   ----- Message ----- From: Anson Fret, MD Sent: 05/24/2017  11:26 AM To: Geronimo Running, RN  No seizure-like activity. The EEG is slowed which is non specific and can be seen in drowsiness, dementia, illness, medication effect thanks

## 2017-05-24 NOTE — Telephone Encounter (Signed)
I called patient about EEG results but she did not answer. I left a message asking that she return my call to go over results.

## 2017-05-24 NOTE — Procedures (Signed)
     History: Kimberly Ellison is a 61 year old patient with a history of bipolar disorder and diabetes with a history of COPD and prior TIA. The patient has reported some difficulty with short-term memory, she has had some general progression and deterioration of cognitive functioning. The patient is being evaluated for this issue.  This is a routine EEG. No skull defects are noted. Medications include aspirin, Lipitor, Symbicort, iron supplementation, Lamictal, Zyprexa, and Restoril.  EEG classification: Dysrhythmia grade 1 generalized  Description of the recording: The background rhythms of this recording consists of a well modulated medium amplitude 7 Hz theta frequency activity that is reactive to eye opening and closure. As the record progresses, photic stimulation is performed, and this results in a bilateral and symmetric photic driving response. Hyperventilation is then performed resulting in a minimal buildup of the background rhythm activities without significant slowing seen. At no time during the recording does there appear to be evidence of spike or spike wave discharges or evidence of focal slowing. EKG monitor shows no evidence of cardiac rhythm abnormalities with a heart rate of 66.  Impression: This is an abnormal EEG recording secondary to diffuse generalized theta frequency background slowing. This is a nonspecific recording and can be seen with any process that results and a mild toxic or metabolic encephalopathy or any dementing type illness. No epileptiform discharges were seen.

## 2017-05-26 ENCOUNTER — Other Ambulatory Visit: Payer: BLUE CROSS/BLUE SHIELD

## 2017-05-26 ENCOUNTER — Ambulatory Visit
Admission: RE | Admit: 2017-05-26 | Discharge: 2017-05-26 | Disposition: A | Discharge status: Home / Self Care | Payer: BLUE CROSS/BLUE SHIELD | Source: Ambulatory Visit | Attending: Neurology | Admitting: Neurology

## 2017-05-26 DIAGNOSIS — R413 Other amnesia: Secondary | ICD-10-CM | POA: Diagnosis not present

## 2017-05-26 DIAGNOSIS — R419 Unspecified symptoms and signs involving cognitive functions and awareness: Secondary | ICD-10-CM | POA: Diagnosis not present

## 2017-05-26 MED ORDER — GADOBENATE DIMEGLUMINE 529 MG/ML IV SOLN
17.0000 mL | Freq: Once | INTRAVENOUS | Status: AC | PRN
  Administered 2017-05-26: 17 mL via INTRAVENOUS

## 2017-05-30 ENCOUNTER — Other Ambulatory Visit: Payer: Self-pay | Admitting: Neurology

## 2017-05-30 DIAGNOSIS — R4189 Other symptoms and signs involving cognitive functions and awareness: Secondary | ICD-10-CM

## 2017-06-07 ENCOUNTER — Telehealth: Payer: Self-pay | Admitting: Neurology

## 2017-06-07 NOTE — Telephone Encounter (Signed)
John[facility] [phone] opt 6 called request Annabelle HarmanDana to call him back tomorrow. He is leaving the office at 4:30. Thank you

## 2017-06-08 ENCOUNTER — Telehealth: Payer: Self-pay | Admitting: Neurology

## 2017-06-08 NOTE — Telephone Encounter (Signed)
[facility]

## 2017-06-08 NOTE — Telephone Encounter (Signed)
Returned John's phone call asked him to cal me back on my direct line [phone].

## 2017-06-09 ENCOUNTER — Telehealth: Payer: Self-pay | Admitting: Internal Medicine

## 2017-06-09 MED ORDER — ALBUTEROL SULFATE HFA 108 (90 BASE) MCG/ACT IN AERS: 2.0000 | INHALATION_SPRAY | Freq: Four times a day (QID) | RESPIRATORY_TRACT | 1 refills | Status: DC | PRN

## 2017-06-09 NOTE — Telephone Encounter (Signed)
Called and spoke with pt and she is aware of refill that has been sent to the pharmacy and nothing further is needed. 

## 2017-06-09 NOTE — Telephone Encounter (Signed)
Called Kimberly Ellison and left him a message Patient want's to keep her apt [facility]  [facility] in two weeks.

## 2017-06-09 NOTE — Telephone Encounter (Signed)
Plan A = Automatic = Symbicort 80 Take 2 puffs first thing in am and then another 2 puffs about 12 hours later.  Also continue protonix (pantoprazole ) 40 mg Take 30-60 min before first meal of the day or Prilosec over the counter plus pepcid 20 mg at bedtime (also over the counter  For drainage / throat tickle try take CHLORPHENIRAMINE  4 mg - take one every 4 hours as needed - available over the counter- may cause drowsiness so start with just a bedtime dose or two and see how you tolerate it before trying in daytime    Work on inhaler technique:  relax and gently blow all the way out then take a nice smooth deep breath back in, triggering the inhaler at same time you start breathing in.  Hold for up to 5 seconds if you can. Blow out thru nose. Rinse and gargle with water when done     Plan B = Backup Only use your albuterol (PROAIR) as a rescue medication to be used if you can't catch your breath by resting or doing a relaxed purse lip breathing pattern.  - The less you use it, the better it will work when you need it. - Ok to use the inhaler up to 2 puffs  every 4 hours if you must but call for appointment if use goes up over your usual need - Don't leave home without it !!  (think of it like the spare tire for your car)    Please schedule a follow up office visit in 6 weeks, call sooner if needed with pfts first  - don't use your proair w/in 4 hours of test and we will give it to you here     Pt called and stated that she has appt with [facility] on 10/23 and wanted to get a refill of her ventolin HFA.  Please advise if you are ok with this refill.  Thanks

## 2017-06-09 NOTE — Telephone Encounter (Signed)
Yes but be sure she understands it's the same as proair so not using both  - refill x one

## 2017-06-13 ENCOUNTER — Ambulatory Visit: Payer: BLUE CROSS/BLUE SHIELD | Admitting: Internal Medicine

## 2017-06-13 ENCOUNTER — Encounter: Payer: Self-pay | Admitting: Internal Medicine

## 2017-06-13 ENCOUNTER — Ambulatory Visit (INDEPENDENT_AMBULATORY_CARE_PROVIDER_SITE_OTHER): Payer: BLUE CROSS/BLUE SHIELD | Admitting: Internal Medicine

## 2017-06-13 VITALS — BP 120/80 | HR 90 | Ht 65.0 in | Wt 182.0 lb

## 2017-06-13 DIAGNOSIS — R058 Other specified cough: Secondary | ICD-10-CM

## 2017-06-13 DIAGNOSIS — R05 Cough: Secondary | ICD-10-CM

## 2017-06-13 DIAGNOSIS — J449 Chronic obstructive pulmonary disease, unspecified: Secondary | ICD-10-CM

## 2017-06-13 MED ORDER — PANTOPRAZOLE SODIUM 40 MG PO TBEC: 40.0000 mg | DELAYED_RELEASE_TABLET | Freq: Every day | ORAL | 2 refills | Status: AC

## 2017-06-13 MED ORDER — MOMETASONE FURO-FORMOTEROL FUM 100-5 MCG/ACT IN AERO: 2.0000 | INHALATION_SPRAY | Freq: Two times a day (BID) | RESPIRATORY_TRACT | 0 refills | Status: DC

## 2017-06-13 MED ORDER — FAMOTIDINE 20 MG PO TABS: ORAL_TABLET | ORAL | 2 refills | Status: AC

## 2017-06-13 MED ORDER — MOMETASONE FURO-FORMOTEROL FUM 100-5 MCG/ACT IN AERO: INHALATION_SPRAY | RESPIRATORY_TRACT | 11 refills | Status: DC

## 2017-06-13 NOTE — Patient Instructions (Addendum)
Plan A = Automatic =  Dulera 100 Take 2 puffs first thing in am and then another 2 puffs about 12 hours later.  Also continue protonix (pantoprazole ) 40 mg Take 30-60 min before first meal of the day or Prilosec over the counter plus pepcid 20 mg at bedtime (also over the counter  For drainage / throat tickle try take CHLORPHENIRAMINE  4 mg - take one every 4 hours as needed - available over the counter- may cause drowsiness so start with just a bedtime dose or two and see how you tolerate it before trying in daytime    Work on maintaining perfect inhaler technique:  relax and gently blow all the way out then take a nice smooth deep breath back in, triggering the inhaler at same time you start breathing in.  Hold for up to 5 seconds if you can. Blow out thru nose. Rinse and gargle with water when done     Plan B = Backup Only use your albuterol (PROAIR) as a rescue medication to be used if you can't catch your breath by resting or doing a relaxed purse lip breathing pattern.  - The less you use it, the better it will work when you need it. - Ok to use the inhaler up to 2 puffs  every 4 hours if you must but call for appointment if use goes up over your usual need - Don't leave home without it !!  (think of it like the spare tire for your car)    GERD (REFLUX)  is an extremely common cause of respiratory symptoms just like yours , many times with no obvious heartburn at all.    It can be treated with medication, but also with lifestyle changes including elevation of the head of your bed (ideally with 6 inch  bed blocks),  Smoking cessation, avoidance of late meals, excessive alcohol, and avoid fatty foods, chocolate, peppermint, colas, red wine, and acidic juices such as orange juice.  NO MINT OR MENTHOL PRODUCTS SO NO COUGH DROPS  USE SUGARLESS CANDY INSTEAD (Jolley ranchers or Stover's or Life Savers) or even ice chips will also do - the key is to swallow to prevent all throat clearing. NO OIL  BASED VITAMINS - use powdered substitutes.     Please schedule a follow up office visit in 6 weeks, call sooner if needed with pfts first- do not use any albuterol  - you must bring all active inhalers and medications with you so we can verify the accuracy of our list - pfts on return

## 2017-06-13 NOTE — Progress Notes (Signed)
Subjective:     Patient ID: Kimberly Ellison, female   DOB: 1956-07-10,    MRN: [id number]    Brief patient profile:  [id number]  yowf quit smoking 04/04/16 with a h/o albuterol dependency for cough >sob dating  Back mid 90's and no better since quit smoking so referred to pulmonary clinic 07/05/2016 by Dr Boneta LucksJennifer Ellison      History of Present Illness  07/05/2016 1st [facility] office visit/ Reilly Blades   Chief Complaint  Patient presents with  . Pulmonary Consult    Referred by Dr. Charlies SilversJennifer Ellison. Pt c/o cough x 4 months.   dry cough x 20 years [place] fall [place] and worse than usual cough / need for albuterol since arrival  Cough worse at hs/ prilosec 20 mg daily after bfast/ once gets to sleep less likely to cough or wake in am coughing. Using lots of cough  Drops Mostly sob when coughing  rec  schedule sinus ct > neg   Instead of prilosec change to Protonix (pantoprazole) Take 30-60 min before first meal of the day and Pepcid 20 mg one bedtime plus chlorpheniramine 4 mg x 2 at bedtime (both available over the counter)  until cough is completely gone for at least a week without the need for cough suppression GERD diet  Please schedule a follow up office visit in 2 weeks, sooner if needed later with all active medications in hand     07/26/2016  f/u ov/Kimberly Ellison re: ASTHMA since birth, 2 saba  inhalers per month on max gerd rx / no meds in hand Chief Complaint  Patient presents with  . Follow-up    2 week follow up. Pt. states the coughing pills are helping. She has not coughed in a month.    felt needed saba w/in one hour of ov  rec Plan A = Automatic = Symbicort 80 (dulera 100) Take 2 puffs first thing in am and then another 2 puffs about 12 hours later.  Work on inhaler technique:   Plan B = Backup Only use your albuterol(proair)  as a rescue medication      09/06/2016  f/u ov/Kimberly Ellison re:   Copd vs asthma confused with meds / maintsymb 80 2bid/ ppi/ hs hs/ again did  not bring meds as req Chief Complaint  Patient presents with  . Follow-up    Pt stated her COPD is stable. Pt stated her cough has improved drastically and is very minimal at this time   still overusing saba/ confused re purpose of symb 80/ really no significant cough since last ov thoroughly confused with instructions Symptoms are daytime only never noct  rec Plan A = Automatic = Symbicort 80 Take 2 puffs first thing in am and then another 2 puffs about 12 hours later.  Also continue protonix (pantoprazole ) 40 mg Take 30-60 min before first meal of the day or Prilosec over the counter plus pepcid 20 mg at bedtime (also over the counter For drainage / throat tickle try take CHLORPHENIRAMINE  4 mg - take one every 4 hours as needed - available over the counter- may cause drowsiness so start with just a bedtime dose or two and see how you tolerate it before trying in daytime   Work on inhaler technique:  Plan B = Backup Only use your albuterol (PROAIR)  Please schedule a follow up office visit in 6 weeks, call sooner if needed with pfts first  - don't use your proair  w/in 4 hours of test and we will give it to you here > did not return as req       06/13/2017  Extended f/u ov/Kimberly Ellison re: re-establish now just on on singulair but still needing ventolin on arising each am  Chief Complaint  Patient presents with  . Follow-up    Breathing is unchanged. She has occ non prod cough. She is using ventolin 2 x per day on average.   wakes up feeling fine around 6 am but by 8 am needs ventolin and by 5 pm needs another 2 pffs sitting at a desk Works as receptionist  Bad hb on prn otcs   No obvious day to day or daytime variability or assoc excess/ purulent sputum or mucus plugs or hemoptysis or cp or chest tightness, subjective wheeze or overt sinus    symptoms. No unusual exp hx or h/o childhood pna/ asthma or knowledge of premature birth.  Sleeping ok flat without nocturnal   exacerbations  of  respiratory  c/o's or need for noct saba. Also denies any obvious fluctuation of symptoms with weather or environmental changes or other aggravating or alleviating factors except as outlined above   Current Allergies, Complete Past Medical History, Past Surgical History, Family History, and Social History were reviewed in [employer] record.  ROS  The following are not active complaints unless bolded Hoarseness, sore throat, dysphagia, dental problems, itching, sneezing,  nasal congestion or discharge of excess mucus or purulent secretions, ear ache,   fever, chills, sweats, unintended wt loss or wt gain, classically pleuritic or exertional cp,  orthopnea pnd or leg swelling, presyncope, palpitations, abdominal pain, anorexia, nausea, vomiting, diarrhea  or change in bowel habits or change in bladder habits, change in stools or change in urine, dysuria, hematuria,  rash, arthralgias, visual complaints, headache, numbness, weakness or ataxia or problems with walking or coordination,  change in mood/affect or memory.        Current Meds  Medication Sig  . albuterol (PROVENTIL HFA;VENTOLIN HFA) 108 (90 Base) MCG/ACT inhaler Inhale 2 puffs into the lungs every 6 (six) hours as needed for wheezing or shortness of breath.  . IRON PO Take 1 tablet by mouth daily.  Kimberly Ellison lamoTRIgine (LAMICTAL) 100 MG tablet Take 100 mg by mouth daily.  . montelukast (SINGULAIR) 10 MG tablet Take 10 mg by mouth daily.  Kimberly Ellison OLANZapine (ZYPREXA) 20 MG tablet Take 20 mg by mouth 2 (two) times daily.  Kimberly Ellison PARoxetine (PAXIL) 40 MG tablet Take 40 mg by mouth every morning.                   Objective:   Physical Exam  amb wf nad freq throat clearing/ mild voice fatigue     09/06/2016       183   07/26/16 181 lb (82.1 kg)  07/05/16 175 lb (79.4 kg)    Vital signs reviewed - Note on arrival 02 sats  99% on RA     HEENT: nl   turbinates, and oropharynx. Nl external ear canals without cough reflex  - full dentures    NECK :  without JVD/Nodes/TM/ nl carotid upstrokes bilaterally   LUNGS: no acc muscle use,  Nl contour chest which is clear to A and P bilaterally without cough on insp or exp maneuvers   CV:  RRR  no s3 or murmur or increase in P2, no edema   ABD:  soft and nontender with nl inspiratory excursion in  the supine position. No bruits or organomegaly, bowel sounds nl  MS:  Nl gait/ ext warm without deformities, calf tenderness, cyanosis or clubbing No obvious joint restrictions   SKIN: warm and dry without lesions    NEURO:  alert, approp, nl sensorium with  no motor deficits       Assessment:

## 2017-06-13 NOTE — Assessment & Plan Note (Addendum)
Quit smoking 03/2016 - Spirometry 07/05/2016  FEV1 1.67 (62%)  Ratio 61 s rx day of ov and classic curvature   - Spirometry 07/26/2016  FEV1 1.76 (65%)  Ratio 71 with minimal curvature   - 07/26/2016  > try symbicort 80 2bid   - 06/13/2017  After extensive coaching HFA effectiveness =    90% > try dulera  2bid plus gerd rx    DDX of  difficult airways management almost all start with A and  include Adherence, Ace Inhibitors, Acid Reflux, Active Sinus Disease, Alpha 1 Antitripsin deficiency, Anxiety masquerading as Airways dz,  ABPA,  Allergy(esp in young), Aspiration (esp in elderly), Adverse effects of meds,  Active smokers, A bunch of PE's (a small clot burden can't cause this syndrome unless there is already severe underlying pulm or vascular dz with poor reserve) plus two Bs  = Bronchiectasis and Beta blocker use..and one C= CHF   In this case Adherence is the biggest issue and starts with  inability to use HFA effectively and also  understand that SABA treats the symptoms but doesn't get to the underlying problem (inflammation).  I used  the analogy of putting steroid cream on a rash to help explain the meaning of topical therapy and the need to get the drug to the target tissue.    -see hfa teaching - return with all meds in hand using a trust but verify approach to confirm accurate Medication  Reconciliation The principal here is that until we are certain that the  patients are doing what we've asked, it makes no sense to ask them to do more.   ? Acid (or non-acid) GERD > always difficult to exclude as up to 75% of pts in some series report no assoc GI/ Heartburn symptoms> rec max (24h)  acid suppression and diet restrictions/ reviewed and instructions given in writing.    ? Allergy > continue singulair, use just the lower dose of dulera 100 for now as uacs component is significant here   ? Sinus CT > note neg sinus CT 06/2016   ? Anxiety > usually at the bottom of this list of usual  suspects but should be much higher on this pt's based on H and P and note already on psychotropics .  ? BB use > n/a  ? chf > not apparent clinically    I had an extended discussion with the patient reviewing all relevant studies completed to date and  lasting 15 to 20 minutes of a 25 minute visit    Each maintenance medication was reviewed in detail including most importantly the difference between maintenance and prns and under what circumstances the prns are to be triggered using an action plan format that is not reflected in the computer generated alphabetically organized AVS.    Please see AVS for specific instructions unique to this visit that I personally wrote and verbalized to the the pt in detail and then reviewed with pt  by my nurse highlighting any  changes in therapy recommended at today's visit to their plan of care.

## 2017-06-16 NOTE — Assessment & Plan Note (Signed)
Allergy profile 07/05/2016 >  Eos 0.1 /  IgE 11 neg RAST  - FENO 07/05/2016  =   37  - Sinus CT 07/11/2016  : Paranasal sinuses clear. -  07/26/2016  Improved on rx for cough   rec max rx for gerd and 1st gen H1 blockers per guidelines

## 2017-06-28 ENCOUNTER — Ambulatory Visit: Payer: BLUE CROSS/BLUE SHIELD | Admitting: Internal Medicine

## 2017-07-25 ENCOUNTER — Ambulatory Visit: Payer: BLUE CROSS/BLUE SHIELD | Admitting: Internal Medicine

## 2017-07-25 ENCOUNTER — Ambulatory Visit (INDEPENDENT_AMBULATORY_CARE_PROVIDER_SITE_OTHER): Payer: BLUE CROSS/BLUE SHIELD | Admitting: Internal Medicine

## 2017-07-25 ENCOUNTER — Encounter: Payer: Self-pay | Admitting: Internal Medicine

## 2017-07-25 VITALS — BP 124/80 | HR 104 | Ht 64.0 in | Wt 187.0 lb

## 2017-07-25 DIAGNOSIS — R05 Cough: Secondary | ICD-10-CM | POA: Diagnosis not present

## 2017-07-25 DIAGNOSIS — E669 Obesity, unspecified: Secondary | ICD-10-CM

## 2017-07-25 DIAGNOSIS — R058 Other specified cough: Secondary | ICD-10-CM

## 2017-07-25 DIAGNOSIS — J4530 Mild persistent asthma, uncomplicated: Secondary | ICD-10-CM | POA: Diagnosis not present

## 2017-07-25 DIAGNOSIS — J449 Chronic obstructive pulmonary disease, unspecified: Secondary | ICD-10-CM | POA: Diagnosis not present

## 2017-07-25 LAB — PULMONARY FUNCTION TEST
DL/VA % PRED: 95 %
DL/VA: 4.56 ml/min/mmHg/L
DLCO cor % pred: 80 %
DLCO cor: 19.5 ml/min/mmHg
DLCO unc % pred: 79 %
DLCO unc: 19.26 ml/min/mmHg
FEF 25-75 PRE: 1.22 L/s
FEF 25-75 Post: 1.56 L/sec
FEF2575-%CHANGE-POST: 28 %
FEF2575-%PRED-POST: 65 %
FEF2575-%Pred-Pre: 51 %
FEV1-%CHANGE-POST: 8 %
FEV1-%PRED-PRE: 62 %
FEV1-%Pred-Post: 68 %
FEV1-PRE: 1.61 L
FEV1-Post: 1.75 L
FEV1FVC-%CHANGE-POST: 5 %
FEV1FVC-%PRED-PRE: 94 %
FEV6-%Change-Post: 3 %
FEV6-%PRED-POST: 70 %
FEV6-%Pred-Pre: 67 %
FEV6-PRE: 2.17 L
FEV6-Post: 2.24 L
FEV6FVC-%PRED-PRE: 104 %
FEV6FVC-%Pred-Post: 104 %
FVC-%CHANGE-POST: 3 %
FVC-%PRED-POST: 67 %
FVC-%PRED-PRE: 65 %
FVC-POST: 2.24 L
FVC-PRE: 2.17 L
POST FEV1/FVC RATIO: 78 %
PRE FEV6/FVC RATIO: 100 %
Post FEV6/FVC ratio: 100 %
Pre FEV1/FVC ratio: 74 %
RV % PRED: 117 %
RV: 2.32 L
TLC % pred: 97 %
TLC: 4.9 L

## 2017-07-25 MED ORDER — ALBUTEROL SULFATE HFA 108 (90 BASE) MCG/ACT IN AERS: 2.0000 | INHALATION_SPRAY | Freq: Four times a day (QID) | RESPIRATORY_TRACT | 5 refills | Status: DC | PRN

## 2017-07-25 NOTE — Assessment & Plan Note (Signed)
PFT's 07/25/2017 with ERV 28% c/w component of obesity   Body mass index is 32.1 kg/m.  -  Trending up  Lab Results  Component Value Date   TSH 2.690 05/12/2017     Contributing to gerd risk/ doe/reviewed the need and the process to achieve and maintain neg calorie balance > defer f/u primary care including intermittently monitoring thyroid status

## 2017-07-25 NOTE — Assessment & Plan Note (Signed)
Quit smoking 03/2016 - Spirometry 07/05/2016  FEV1 1.67 (62%)  Ratio 61 s rx day of ov and classic curvature   - Spirometry 07/26/2016  FEV1 1.76 (65%)  Ratio 71 with minimal curvature   - 07/26/2016  > try symbicort 80 2bid  - 06/13/2017  After extensive coaching HFA effectiveness =    90% > try dulera  2bid plus gerd rx  - PFT's  07/25/2017  FEV1 1.75 (68 % ) ratio 78  p 8 % improvement from saba p dulera 100 x 2 prior to study with DLCO  79/80 % corrects to 95  % for alv volume - minimal concavity to f/v loop      Despite smoking hx she now longer fits the criteria for copd and is better characterized as mild chronic asthma with component of restriction related to body habitus and is doing very well on low dose ics/laba plus singulair typical of mild asthma   I had an extended discussion with the patient reviewing all relevant studies completed to date and  lasting 15 to 20 minutes of a 25 minute visit    Each maintenance medication was reviewed in detail including most importantly the difference between maintenance and prns and under what circumstances the prns are to be triggered using an action plan format that is not reflected in the computer generated alphabetically organized AVS.    Please see AVS for specific instructions unique to this visit that I personally wrote and verbalized to the the pt in detail and then reviewed with pt  by my nurse highlighting any  changes in therapy recommended at today's visit to their plan of care.

## 2017-07-25 NOTE — Assessment & Plan Note (Addendum)
Allergy profile 07/05/2016 >  Eos 0.1 /  IgE 11 neg RAST  - FENO 07/05/2016  =   37  - Sinus CT 07/11/2016  : Paranasal sinuses clear. - 07/26/16 improved on rx for gerd     May also have element of eos airways inflammation based on FENo so rec continue dulera 100 2bid/ singulair and gerd rx x 6 m then return to regroup

## 2017-07-25 NOTE — Progress Notes (Signed)
PFT completed today 07/25/17.  

## 2017-07-25 NOTE — Progress Notes (Signed)
Subjective:     Patient ID: Kimberly Ellison, female   DOB: 1956-07-10,    MRN: [id number]    Brief patient profile:  [id number]  yowf quit smoking 04/04/16 with a h/o albuterol dependency for cough >sob dating  Back mid 90's and no better since quit smoking so referred to pulmonary clinic 07/05/2016 by Dr Boneta LucksJennifer Ellison      History of Present Illness  07/05/2016 1st [facility] office visit/ Kimberly Ellison   Chief Complaint  Patient presents with  . Pulmonary Consult    Referred by Dr. Charlies SilversJennifer Ellison. Pt c/o cough x 4 months.   dry cough x 20 years moved [place] fall [place] and worse than usual cough / need for albuterol since arrival  Cough worse at hs/ prilosec 20 mg daily after bfast/ once gets to sleep less likely to cough or wake in am coughing. Using lots of cough  Drops Mostly sob when coughing  rec  schedule sinus ct > neg   Instead of prilosec change to Protonix (pantoprazole) Take 30-60 min before first meal of the day and Pepcid 20 mg one bedtime plus chlorpheniramine 4 mg x 2 at bedtime (both available over the counter)  until cough is completely gone for at least a week without the need for cough suppression GERD diet  Please schedule a follow up office visit in 2 weeks, sooner if needed later with all active medications in hand     07/26/2016  f/u ov/Kimberly Ellison re: ASTHMA since birth, 2 saba  inhalers per month on max gerd rx / no meds in hand Chief Complaint  Patient presents with  . Follow-up    2 week follow up. Pt. states the coughing pills are helping. She has not coughed in a month.    felt needed saba w/in one hour of ov  rec Plan A = Automatic = Symbicort 80 (dulera 100) Take 2 puffs first thing in am and then another 2 puffs about 12 hours later.  Work on inhaler technique:   Plan B = Backup Only use your albuterol(proair)  as a rescue medication      09/06/2016  f/u ov/Kimberly Ellison re:   Copd vs asthma confused with meds / maintsymb 80 2bid/ ppi/ hs hs/ again did  not bring meds as req Chief Complaint  Patient presents with  . Follow-up    Pt stated her COPD is stable. Pt stated her cough has improved drastically and is very minimal at this time   still overusing saba/ confused re purpose of symb 80/ really no significant cough since last ov thoroughly confused with instructions Symptoms are daytime only never noct  rec Plan A = Automatic = Symbicort 80 Take 2 puffs first thing in am and then another 2 puffs about 12 hours later.  Also continue protonix (pantoprazole ) 40 mg Take 30-60 min before first meal of the day or Prilosec over the counter plus pepcid 20 mg at bedtime (also over the counter For drainage / throat tickle try take CHLORPHENIRAMINE  4 mg - take one every 4 hours as needed - available over the counter- may cause drowsiness so start with just a bedtime dose or two and see how you tolerate it before trying in daytime   Work on inhaler technique:  Plan B = Backup Only use your albuterol (PROAIR)  Please schedule a follow up office visit in 6 weeks, call sooner if needed with pfts first  - don't use your proair  w/in 4 hours of test and we will give it to you here > did not return as req       06/13/2017  Extended f/u ov/Kimberly Ellison re: re-establish now just on on singulair but still needing ventolin on arising each am  Chief Complaint  Patient presents with  . Follow-up    Breathing is unchanged. She has occ non prod cough. She is using ventolin 2 x per day on average.   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  Also continue protonix (pantoprazole ) 40 mg Take 30-60 min before first meal of the day or Prilosec over the counter plus pepcid 20 mg at bedtime (also over the counter For drainage / throat tickle try  take CHLORPHENIRAMINE  4 mg - take one every 4 hours as needed - available over the counter- may cause drowsiness so start with just a bedtime dose or two and see how you tolerate it before trying in daytime Work on maintaining perfect inhaler technique:    Plan B = Backup Only use your albuterol (PROAIR) as a rescue medication GERD (REFLUX) Please schedule a follow up office visit in 6 weeks, call sooner if needed with pfts first- do not use any albuterol  - you must bring all active inhalers and medications with you so we can verify the accuracy of our list - pfts on return     07/25/2017  f/u ov/Kimberly Ellison re:  AB on dulera 100 2bid Chief Complaint  Patient presents with  . Follow-up    PFT's done today. Her breathing has improved and she is using her albuterol inhaler 2-3 x per wk on average.    no cough / much less need for saba now  Not limited by breathing from desired activities   No noct symptoms   No obvious day to day or daytime variability or assoc excess/ purulent sputum or mucus plugs or hemoptysis or cp or chest tightness, subjective wheeze or overt sinus or hb symptoms. No unusual exposure hx or h/o childhood pna/ asthma or knowledge of premature birth.  Sleeping ok flat without nocturnal  or early am exacerbation  of respiratory  c/o's or need for noct saba. Also denies any obvious fluctuation of symptoms with weather or environmental changes or other aggravating or alleviating factors except as outlined above   Current Allergies, Complete Past Medical History, Past Surgical History, Family History, and Social History were reviewed in [facility] electronic medical record.  ROS  The following are not active complaints unless bolded Hoarseness, sore throat, dysphagia, dental problems, itching, sneezing,  nasal congestion or discharge of excess mucus or purulent secretions, ear ache,   fever, chills, sweats, unintended wt loss or wt gain, classically pleuritic or exertional cp,   orthopnea pnd or leg swelling, presyncope, palpitations, abdominal pain, anorexia, nausea, vomiting, diarrhea  or change in bowel habits or change in bladder habits, change in stools or change in urine, dysuria, hematuria,  rash, arthralgias, visual complaints, headache, numbness, weakness or ataxia or problems with walking or coordination,  change in mood/affect = anxious or memory.        Current Meds  Medication Sig  . albuterol (PROVENTIL HFA;VENTOLIN HFA)  108 (90 Base) MCG/ACT inhaler Inhale 2 puffs into the lungs every 6 (six) hours as needed for wheezing or shortness of breath.  Marland Kitchen. aspirin EC 81 MG tablet Take 3 tablets by mouth daily.  Marland Kitchen. atorvastatin (LIPITOR) 10 MG tablet Take 1 tablet by mouth daily.  . cyanocobalamin 500 MCG tablet Take 500 mcg by mouth 2 (two) times daily.  . famotidine (PEPCID) 20 MG tablet One at bedtime  . ferrous sulfate 325 (65 FE) MG tablet Take 325 mg by mouth daily with breakfast.  . IRON PO Take 1 tablet by mouth daily.  Marland Kitchen. lamoTRIgine (LAMICTAL) 100 MG tablet Take 100 mg by mouth daily.  . mometasone-formoterol (DULERA) 100-5 MCG/ACT AERO Take 2 puffs first thing in am and then another 2 puffs about 12 hours later.  . montelukast (SINGULAIR) 10 MG tablet Take 10 mg by mouth daily.  . Multiple Vitamin (MULTIVITAMIN) capsule Take 1 capsule by mouth daily.  Marland Kitchen. OLANZapine (ZYPREXA) 20 MG tablet Take 20 mg by mouth 2 (two) times daily.  . pantoprazole (PROTONIX) 40 MG tablet Take 1 tablet (40 mg total) by mouth daily. Take 30-60 min before first meal of the day  .    Marland Kitchen.           Objective:   Physical Exam   amb wf nad / all smiles    07/25/2017        187  09/06/2016       183   07/26/16 181 lb (82.1 kg)  07/05/16 175 lb (79.4 kg)     Vital signs reviewed - Note on arrival 02 sats  96% on RA     HEENT: nl dentition, turbinates bilaterally, and oropharynx. Nl external ear canals without cough reflex   NECK :  without JVD/Nodes/TM/ nl carotid  upstrokes bilaterally   LUNGS: no acc muscle use,  Nl contour chest which is clear to A and P bilaterally without cough on insp or exp maneuvers   CV:  RRR  no s3 or murmur or increase in P2, and no edema   ABD:  soft and nontender with nl inspiratory excursion in the supine position. No bruits or organomegaly appreciated, bowel sounds nl  MS:  Nl gait/ ext warm without deformities, calf tenderness, cyanosis or clubbing No obvious joint restrictions   SKIN: warm and dry without lesions    NEURO:  alert, approp, nl sensorium with  no motor or cerebellar deficits apparent.            Assessment:

## 2017-07-25 NOTE — Patient Instructions (Signed)
No change in medications    Please schedule a follow up visit in 6  months but call sooner if needed  

## 2017-09-11 ENCOUNTER — Ambulatory Visit: Payer: BLUE CROSS/BLUE SHIELD | Admitting: Neurology

## 2017-09-12 ENCOUNTER — Ambulatory Visit: Payer: BLUE CROSS/BLUE SHIELD | Admitting: Neurology

## 2017-09-26 ENCOUNTER — Encounter: Payer: Self-pay | Admitting: Psychology

## 2017-09-26 ENCOUNTER — Ambulatory Visit (INDEPENDENT_AMBULATORY_CARE_PROVIDER_SITE_OTHER): Payer: BLUE CROSS/BLUE SHIELD | Admitting: Psychology

## 2017-09-26 DIAGNOSIS — F1421 Cocaine dependence, in remission: Secondary | ICD-10-CM

## 2017-09-26 DIAGNOSIS — R413 Other amnesia: Secondary | ICD-10-CM | POA: Diagnosis not present

## 2017-09-26 DIAGNOSIS — Z87898 Personal history of other specified conditions: Secondary | ICD-10-CM

## 2017-09-26 DIAGNOSIS — F3160 Bipolar disorder, current episode mixed, unspecified: Secondary | ICD-10-CM

## 2017-09-26 NOTE — Progress Notes (Signed)
NEUROBEHAVIORAL STATUS EXAM   Name: Kimberly Ellison Date of Birth: [birth year] Date of Interview: 09/26/2017  Reason for Referral:  Kimberly Ellison is a 62 y.o. female who is referred for neuropsychological evaluation by Dr. Naomie DeanAntonia Ahern of [facility] due to concerns about memory loss. This patient is accompanied in the office by her daughter who supplements the history.  History of Presenting Problem:  Kimberly Ellison was seen by Dr. Lucia GaskinsAhern for neurologic consultation of memory loss on 05/12/2017. MMSE was 22/30. A history of memory decline over the prior 5-6 years was reported by her daughter. 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 Her history is complicated by polysubstance abuse/dependence (cocaine, marijuana, alcohol, Tramadol - all reportedly in sustained remission at this time). She also has a history of bipolar disorder, treated with Lamictal and Zyprexa. Family history is significant for dementia in both parents (in their 70s/80s). EEG completed on 05/24/2017 reportedly revealed diffuse generalized theta frequency background slowing, no epileptiform discharges were seen. Brain MRI completed on 05/26/2017 reportedly revealed small left hemispheric focus consistent with a small chronic lacunar infarction, 2 T2/FLAIR punctate hyperintense foci in the right cerebellar hemisphere and right hemisphere consistent with minimal chronic microvascular ischemic changes, no acute findings.  Today (09/26/2017), the patient reports she is aware of and very concerned about memory decline. She reports she can't recall what people have told her. Her daughter states that the patient has had progressive memory/cognitive issues over the past 5-6 years, perhaps even longer. She reports her mother has  significant difficulty learning new routes, even easy ones. She forgets recent conversations/events, she has difficulty learning new processes at work, she repeats superficial conversations, and she misplaces items. She has difficulty concentrating and gets distracted easily. She has word finding difficulty and comprehension difficulty. She denies any trouble managing her medications.   Her daughter feels that the patient's anxiety and lack of confidence (which is new) exacerbates or may contribute to memory difficulty. The patient is "scared of anything new", immediately assumes she cannot remember or learn it. Her daughter thinks she is capable of more than she does. She gets scared of getting lost and panics so much that she can't even follow directions on GPS. It is very difficult for the patient's daughter to redirect her when she is like that. The patient always eventually pulls herself together and gets where she needs to go once she calms down enough to concentrate.  The patient was living alone up until a week ago, her sister moved in with her and this has been good for her. Her sister is very obese and unable to care for herself so the patient is helping her.   The patient's daughter manages her money and gives her an allowance because the patient has a history of significant impulsivity with overspending.   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 It has taken her a long time to learn simple tasks but she is starting to catch on. She goes to work 40 hours a week. She visits her daughter and grandkids at their home on the weekends.   Previously she worked as a Interior and spatial designerhairdresser but about 2 years ago she stopped doing hair because she "cut a hole in someone's head".  The patient denies any difficulty with walking or balance but her daughter states she has fallen at least twice. She does not think she hit her head. No LOC. Falls may have been due to  shoes she was wearing.   The patient denies any change in appetite. She does not eat healthy (lots of fast food).   She denies sleep difficulty. Her daughter is concerned that she sleeps too much.  The patient and her daughter report improved mood since the patient's sister moved in with her. Her daughter was worried about depression, but the patient denies feeling depressed. She does get anxious very easily. She has a history of manic and hypomanic episodes, but not as much lately.   Psychiatric History: The patient was diagnosed with bipolar disorder in her early 48s. She had variable compliance with psychotropic medication over the years but has been compliant with medication over the past 20 years, per the patient. She has been hospitalized for bipolar disorder and was suicidal in the past.   She used cocaine for 20-30 years, daily use. She was taken to the hospital several times for near-overdoses. She reports her last cocaine use was probably in 2001 (daughter is not so sure about this).  She also used alcohol heavily in the past. She reported that she was not drinking heavily for a long time but then several weeks ago she "bought a bottle" and drank it. Otherwise she states she has a cocktail sometimes with dinner.   She admits she has also abused pain medication (Tramadol) from the pain clinic, most recently just over a year ago. She no longer is prescribed this medication.  She has attended residential treatment facilities for substance abuse/dependence in the past. She also attended AA in the past.  She sees her psychiatrist regularly and she also is seeing a counselor now, approximately once a month.  She denies any history of visual/auditory hallucinations.  Family psychiatric history is reportedly significant for bipolar disorder in her older sister and possible substance abuse (pills) in her sister.   Social History: Born/Raised: [place], [place] Education: 11th grade  (had daughter at age 89)  Occupational history: Hairdresser for many years, now working as Scientist, physiological for [employer] Marital history: Divorced, married 3x. One daughter. Alcohol: Reports occasional use now; reports remote history of heavy drinking. Tobacco: Former daily smoker, quit in 2017 SA: Denies current substance abuse. Reports remote history of daily cocaine use and marijuana use.   Medical History: Past Medical History:  Diagnosis Date  . Asthma   . Bipolar 1 disorder (HCC)   . COPD (chronic obstructive pulmonary disease) (HCC)   . Diabetes (HCC)   . DJD (degenerative joint disease)   . GERD (gastroesophageal reflux disease)   . Insomnia   . Transient cerebral ischemia       Current Medications:  Outpatient Encounter Medications as of 09/26/2017  Medication Sig  . albuterol (PROVENTIL HFA;VENTOLIN HFA) 108 (90 Base) MCG/ACT inhaler Inhale 2 puffs into the lungs every 6 (six) hours as needed for wheezing or shortness of breath.  Marland Kitchen aspirin EC 81 MG tablet Take 3 tablets by mouth daily.  Marland Kitchen atorvastatin (LIPITOR) 10 MG tablet Take 1 tablet by mouth daily.  . cyanocobalamin 500 MCG tablet Take 500 mcg by mouth 2 (two) times daily.  . famotidine (PEPCID) 20 MG tablet One at bedtime  . ferrous sulfate 325 (65 FE) MG tablet Take 325 mg by mouth daily with breakfast.  . IRON PO Take 1  tablet by mouth daily.  Marland Kitchen lamoTRIgine (LAMICTAL) 100 MG tablet Take 100 mg by mouth daily.  . mometasone-formoterol (DULERA) 100-5 MCG/ACT AERO Take 2 puffs first thing in am and then another 2 puffs about 12 hours later.  . montelukast (SINGULAIR) 10 MG tablet Take 10 mg by mouth daily.  . Multiple Vitamin (MULTIVITAMIN) capsule Take 1 capsule by mouth daily.  Marland Kitchen OLANZapine (ZYPREXA) 20 MG tablet Take 20 mg by mouth 2 (two) times daily.  . pantoprazole (PROTONIX) 40 MG tablet Take 1 tablet (40 mg total) by mouth daily. Take 30-60 min before first meal of the day   No  facility-administered encounter medications on file as of 09/26/2017.      Behavioral Observations:   Appearance: Neatly, casually and appropriately dressed and groomed Gait: Ambulated independently, no gross abnormalities observed Speech: Fluent; normal rate, rhythm and volume. Repeats herself occasionally. Thought process: Generally linear Affect: Full, euthymic Interpersonal: Pleasant, appropriate, somewhat childish at times.   60 minutes spent face-to-face with patient completing neurobehavioral status exam. 35 minutes spent integrating medical records/clinical data and completing this report. CPT codes 96045; [id number].   TESTING: There is medical necessity to proceed with neuropsychological assessment as the results will be used to aid in differential diagnosis and clinical decision-making and to inform specific treatment recommendations. Per the patient, her daughter and medical records reviewed, there has been a change in cognitive functioning and a reasonable suspicion of neurocognitive disorder (dementia versus pseudodementia 2/2 mood disorder).  Clinical Decision Making: In considering the patient's current level of functioning, level of presumed impairment, nature of symptoms, emotional and behavioral responses during the interview, level of literacy, and observed level of motivation, a battery of tests was selected and communicated to the psychometrician.    PLAN: The patient will return to complete the above referenced full battery of neuropsychological testing with a psychometrician under my supervision. Education regarding testing procedures was provided to the patient. Subsequently, the patient will see this provider for a follow-up session at which time her test performances and my impressions and treatment recommendations will be reviewed in detail.  Evaluation ongoing; full report to follow.

## 2017-10-04 ENCOUNTER — Ambulatory Visit (INDEPENDENT_AMBULATORY_CARE_PROVIDER_SITE_OTHER): Payer: BLUE CROSS/BLUE SHIELD | Admitting: Psychology

## 2017-10-04 DIAGNOSIS — R413 Other amnesia: Secondary | ICD-10-CM

## 2017-10-04 NOTE — Progress Notes (Signed)
   Neuropsychology Note  Kimberly Ellison completed 60 minutes of neuropsychological testing with technician, Wallace Kellerana Chamberlain, BS, under the supervision of Dr. Elvis CoilMaryBeth Bailar, Licensed Psychologist. The patient did not appear overtly distressed by the testing session, per behavioral observation or via self-report to the technician. Rest breaks were offered.   Clinical Decision Making: In considering the patient's current level of functioning, level of presumed impairment, nature of symptoms, emotional and behavioral responses during the interview, level of literacy, and observed level of motivation/effort, a battery of tests was selected and communicated to the psychometrician.  Communication between the psychologist and technician was ongoing throughout the testing session and changes were made as deemed necessary based on patient performance on testing, technician observations and additional pertinent factors such as those listed above.  Kimberly Ellison will return within approximately 2 weeks for an interactive feedback session with Dr. Alinda DoomsBailar at which time her test performances, clinical impressions and treatment recommendations will be reviewed in detail. The patient understands she can contact our office should she require our assistance before this time.  20 minutes spent performing neuropsychological evaluation services/clinical decision making (psychologist). [CPT 96132] 60 minutes spent face-to-face with patient administering standardized tests, 30 minutes spent scoring (technician). [CPT [id number], 96139]  Full report to follow.

## 2017-10-09 ENCOUNTER — Ambulatory Visit: Payer: BLUE CROSS/BLUE SHIELD | Admitting: Neurology

## 2017-10-09 ENCOUNTER — Encounter: Payer: Self-pay | Admitting: Neurology

## 2017-10-09 VITALS — BP 128/80 | HR 91 | Ht 65.0 in | Wt 189.0 lb

## 2017-10-09 DIAGNOSIS — I6381 Other cerebral infarction due to occlusion or stenosis of small artery: Secondary | ICD-10-CM

## 2017-10-09 DIAGNOSIS — R4189 Other symptoms and signs involving cognitive functions and awareness: Secondary | ICD-10-CM | POA: Diagnosis not present

## 2017-10-09 NOTE — Progress Notes (Signed)
[facility]    Provider:  Dr Jaynee [facility] Referring Provider: Dewayne Shorter, [place]* Primary Care Physician:  Dewayne Shorter, PA-C  CC:  Memory difficulty  Interval history 10/09/2017: Memory is stable. Everything is the same. Met with Dr. Si Raider on the the 5th pending follow up. No progression of the memory loss. No new symptoms. She now lives with her sister, having a great time. Sister has bipolar disorder. Neither parents had bipolar disorder. Dad with alzheimers died at 26.  Reviewed imaging of the brain with patient, showed her images, discussed stroke.  This MRI of the brain with and without contrast shows the following: 1.   Small left hemispheric focus consistent with a small chronic lacunar infarction. There are also 2 T2/FLAIR punctate hyperintense foci in the right cerebellar hemisphere and right hemisphere consistent with minimal chronic microvascular ischemic changes. None of these appear to be acute. 2.    There is a normal enhancement pattern and there are no acute findings.   HPI:  Kimberly Ellison is a 62 y.o. female here as a referral from Dr. Antony Salmon for memory problems. Past medical history of bipolar disorder on Lamictal, cocaine addiction, depression, diabetes, anxiety, insomnia, marijuana dependence, suicidal ideation, TIA, COPD, HLD, previous smoker. She is here with daughter who provides much information. She divorced 8 years ago and moved in with a relative. 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 Mother passed away 2 years ago and patient moved to this area, they had to have a calendar and keep notes (aunt helped patient). More short term memory, dates, names, recent  conversations, repeats things she says and asks the same questions in the same day multiple times over and over in the same day. 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 She can't manage the printer or put paper in it, forgets daily. Slowly progresses but waxes and wanes as well. Patient forgot her son-in-law's birthday and had enough, thought it was time to be evaluated because everyone has told her for years that she is having memory problems. Father had dementia died at 80 and had dementia at least 6 years prior to death, he was a smoker but no known use of substance abuse in her family. Mother developed memory loss in her 8s. Patient has blank stares, alteration of awareness. She does not respond initially. Patient is completely aware when she is staring per patient, she just doesn't doesn't answer. No hx of seizure. She is on medication for bipolar disorder and says she is well controlled but daughter believes she has more depression than she says she does, she has manic states and most recently this past July called daughter crying saying she couldn't handle things and there is major anxiety and stress ongoing.   Reviewed notes, labs and imaging from outside physicians, which showed:  Reviewed primary care notes and notes from [facility]. She presented with memory issues. Becoming more forgetful for patient. That short-term memory for several years per daughter. Daughter states patient is worried about getting lost in developing memory  issues. She has to tell the patient several times how to put paper copy her work and had to do other things at work. Mostly short-term memory. Patient's mother and father both had dementia. Patient also gets a blank stare and her face and doesn't remember anything happening. She drinks 2-4 L of  Diet Coke every day. She is treated by psychiatry for bipolar disorder. She performs all her own ADLs and drives just worried about getting lost. MMSE was 27 out of 30 05/09/2017.     Review of Systems: Patient complains of symptoms per HPI as well as the following symptoms: Increased thirst, joint pain, shortness of breath, cough, wheezing, snoring, hearing loss. Pertinent negatives and positives per HPI. All others negative.   Social History   Socioeconomic History  . Marital status: Divorced    Spouse name: Not on file  . Number of children: 1  . Years of education: 42  . Highest education level: Not on file  Social Needs  . Financial resource strain: Not on file  . Food insecurity - worry: Not on file  . Food insecurity - inability: Not on file  . Transportation needs - medical: Not on file  . Transportation needs - non-medical: Not on file  Occupational History    Comment: [employer]  Tobacco Use  . Smoking status: Former Smoker    Packs/day: 2.00    Years: 46.00    Pack years: 92.00    Types: Cigarettes    Last attempt to quit: 04/04/2016    Years since quitting: 1.5  . Smokeless tobacco: Never Used  Substance and Sexual Activity  . Alcohol use: No  . Drug use: No    Comment: hx cocaine, marijuana   . Sexual activity: Not on file  Other Topics Concern  . Not on file  Social History Narrative   Lives alone   Drinks 2-4 liters Diet Coke daily   Right handed    Family History  Problem Relation Age of Onset  . Lymphoma Mother   . Hypertension Mother   . Stroke Mother   . Hypertension Sister   . Diabetes Sister   . Diabetes Daughter     Past Medical History:  Diagnosis Date  . Asthma   . Bipolar 1 disorder ([facility])   . COPD (chronic obstructive pulmonary disease) ([facility])   . Diabetes ([facility])   . DJD (degenerative joint disease)   . GERD (gastroesophageal reflux disease)   . Insomnia   . Transient cerebral ischemia     Past Surgical History:  Procedure  Laterality Date  . ABDOMINAL HYSTERECTOMY  1999  . WISDOM TOOTH EXTRACTION      Current Outpatient Medications  Medication Sig Dispense Refill  . albuterol (PROVENTIL HFA;VENTOLIN HFA) 108 (90 Base) MCG/ACT inhaler Inhale 2 puffs into the lungs every 6 (six) hours as needed for wheezing or shortness of breath. 1 Inhaler 5  . aspirin 325 MG tablet Take 325 mg by mouth daily.    Marland Kitchen atorvastatin (LIPITOR) 10 MG tablet Take 1 tablet by mouth daily.  1  . cyanocobalamin 500 MCG tablet Take 500 mcg by mouth 2 (two) times daily.    . famotidine (PEPCID) 20 MG tablet One at bedtime 30 tablet 2  . ferrous sulfate 325 (65 FE) MG tablet Take 325 mg by mouth daily with breakfast.    . IRON PO Take 1 tablet by mouth daily.    Marland Kitchen lamoTRIgine (LAMICTAL) 100 MG tablet Take 100 mg by  mouth daily.    . mometasone-formoterol (DULERA) 100-5 MCG/ACT AERO Take 2 puffs first thing in am and then another 2 puffs about 12 hours later. (Patient taking differently: Take 2 puffs first thing in am and then another 2 puffs about 12 hours later. Takes as needed) 1 Inhaler 11  . montelukast (SINGULAIR) 10 MG tablet Take 10 mg by mouth daily.    . Multiple Vitamin (MULTIVITAMIN) capsule Take 1 capsule by mouth daily.    Marland Kitchen OLANZapine (ZYPREXA) 20 MG tablet Take 20 mg by mouth 2 (two) times daily.    . pantoprazole (PROTONIX) 40 MG tablet Take 1 tablet (40 mg total) by mouth daily. Take 30-60 min before first meal of the day 30 tablet 2   No current facility-administered medications for this visit.     Allergies as of 10/09/2017 - Review Complete 10/09/2017  Allergen Reaction Noted  . Penicillins Hives 07/05/2016    Vitals: BP 128/80 (BP Location: Right Arm, Patient Position: Sitting)   Pulse 91   Ht _0  (1.651 m)   Wt 189 lb (85.7 kg)   BMI 31.45 kg/m  Last Weight:  Wt Readings from Last 1 Encounters:  10/09/17 189 lb (85.7 kg)   Last Height:   Ht Readings from Last 1 Encounters:  10/09/17 _1  (1.651 m)    Physical exam: Exam: Gen: NAD, conversant, well nourised, obese, well groomed                     CV: RRR, no MRG. No Carotid Bruits. No peripheral edema, warm, nontender Eyes: Conjunctivae clear without exudates or hemorrhage  Neuro: Detailed Neurologic Exam  Speech:    Speech is normal; fluent and spontaneous with normal comprehension.  Cognition:  MMSE - Mini Mental State Exam 05/12/2017  Orientation to time 3  Orientation to Place 3  Registration 3  Attention/ Calculation 5  Recall 1  Language- name 2 objects 2  Language- repeat 0  Language- follow 3 step command 3  Language- read & follow direction 1  Write a sentence 1  Copy design 0  Total score 22      The patient is oriented to person, place, and time;     recent and remote memory intact;     language fluent;     Impaired attention, concentration, fund of knowledge Cranial Nerves:    The pupils are equal, round, and reactive to light. Attempted fundoscopic exam could not visualize. . Visual fields are full to finger confrontation. Extraocular movements are intact. Trigeminal sensation is intact and the muscles of mastication are normal. The face is symmetric. The palate elevates in the midline. Hearing intact. Voice is normal. Shoulder shrug is normal. The tongue has normal motion without fasciculations.   Coordination:    Normal finger to nose and heel to shin. Normal rapid alternating movements.   Gait:    Heel-toe and tandem gait are normal.   Motor Observation:    No asymmetry, no atrophy, and no involuntary movements noted. Tone:    Normal muscle tone.    Posture:    Posture is normal. normal erect    Strength:    Strength is V/V in the upper and lower limbs.      Sensation: intact to LT     Reflex Exam:  DTR's:    Deep tendon reflexes in the upper and lower extremities are normal bilaterally.   Toes:    The toes are downgoing bilaterally.  Clonus:    Clonus is absent.         Assessment/Plan:  62 year old with memory loss, more short-term, progressive. FHx of dementia in both parents. Complicated by a history of substance abuse including cocaine, marijuana, alcohol (15 years ago) and more recent medication abuse of Tramadol. No alcohol use, maybe an ocassional drink when they eat. No medication overuse for one year. She lives with daughter and works at [employer] so she is monitored. Last substance abuse was over a year ago.  Marland Kitchen Recommended f/u with psychiatry and therapy for psychiatric illnesses. Checked labs such as B12 and tsh as these can cause neuropsychiatric problems. Will check lamictal level for long term med use.  Follow up with Dr Si Raider Discussed lacunar infarct today, stroke prevention and reviewed all MRI images with patient.  Discussed lacunar infarct with patient and daughter. Discussed with patient and daughter. ASA 363m for secondary stroke prevention and maintain strict control of hypertension with blood pressure goal below 130/90, diabetes with hemoglobin A1c goal below 6.5% and lipids with LDL cholesterol goal below 70 mg/dL I also advised the patient to eat a healthy diet with plenty of whole grains, cereals, fruits and vegetables, exercise regularly and maintain ideal body weight .Followup in the future with me after formal neurocognitive testing and also ordered cognitive therapy.   Cc: CDewayne Shorter PA-C  ASarina Ill MD  [facility] [address] [address] Phone [phone] [phone] A total of 25 minutes was spent in with this patient. Over half this time was spent on counseling patient on the lacunar stroke, cognitive deficits diagnosis and different therapeutic options available.

## 2017-10-10 NOTE — Progress Notes (Signed)
NEUROPSYCHOLOGICAL EVALUATION   Name:    Kimberly Ellison  Date of Birth:   [birth year] Date of Interview:  09/26/2017 Date of Testing:  10/04/2017   Date of Feedback:  10/12/2017       Background Information:  Reason for Referral:  Kimberly Ellison is a 62 y.o. female referred by Dr. Sarina Ill of [facility] to assess her current level of cognitive functioning and assist in differential diagnosis. The current evaluation consisted of a review of available medical records, an interview with the patient and her daughter, and the completion of a neuropsychological testing battery. Informed consent was obtained.  History of Presenting Problem:  Kimberly Ellison was seen by Dr. Jaynee Eagles for neurologic consultation of memory loss on 05/12/2017. MMSE was 22/30. A history of memory decline over the prior 5-6 years was reported by her daughter. 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 Her history is complicated by polysubstance abuse/dependence (cocaine, marijuana, alcohol, Tramadol - all reportedly in sustained remission at this time). She also has a history of bipolar disorder, treated with Lamictal and Zyprexa. Family history is significant for dementia in both parents (in their 39s/80s). EEG completed on 05/24/2017 reportedly revealed diffuse generalized theta frequency background slowing, no epileptiform discharges were seen. Brain MRI completed on 05/26/2017 reportedly revealed small left hemispheric focus consistent with a small chronic lacunar infarction, 2 T2/FLAIR punctate hyperintense foci in the right cerebellar hemisphere and right hemisphere consistent with minimal chronic microvascular ischemic changes, no acute findings.  Today (09/26/2017), the patient reports she is aware of and  very concerned about memory decline. She reports she can't recall what people have told her. Her daughter states that the patient has had progressive memory/cognitive issues over the past 5-6 years, perhaps even longer. She reports her mother has significant difficulty learning new routes, even easy ones. She forgets recent conversations/events, she has difficulty learning new processes at work, she repeats superficial conversations, and she misplaces items. She has difficulty concentrating and gets distracted easily. She has word finding difficulty and comprehension difficulty. She denies any trouble managing her medications.   Her daughter feels that the patient's anxiety and lack of confidence (which is new) exacerbates or may contribute to memory difficulty. The patient is "scared of anything new", immediately assumes she cannot remember or learn it. Her daughter thinks she is capable of more than she does. She gets scared of getting lost and panics so much that she can't even follow directions on GPS. It is very difficult for the patient's daughter to redirect her when she is like that. The patient always eventually pulls herself together and gets where she needs to go once she calms down enough to concentrate.  The patient was living alone up until a week ago, her sister moved in with her and this has been good for her. Her sister is very obese and unable to care for herself so the patient is helping her.   The patient's daughter manages her money and gives her an allowance because the patient has a history of significant impulsivity with overspending.   Ms. 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 It has taken her a long time to learn simple tasks but she is starting to catch on.  She goes to work 40 hours a week. She visits her daughter and grandkids at their home on the weekends.   Previously she worked as a Theme park manager but about 2 years ago she  stopped doing hair because she "cut a hole in someone's head".   The patient denies any difficulty with walking or balance but her daughter states she has fallen at least twice. She does not think she hit her head. No LOC. Falls may have been due to shoes she was wearing.   The patient denies any change in appetite. She does not eat healthy foods (lots of fast food).   She denies sleep difficulty. Her daughter is concerned that she sleeps too much.  The patient and her daughter report improved mood since the patient's sister moved in with her. Her daughter was worried about depression, but the patient denies feeling depressed. She does get anxious very easily. She has a history of manic and hypomanic episodes, but not as much lately.   Psychiatric History: The patient was diagnosed with bipolar disorder in her early 32s. She had variable compliance with psychotropic medication over the years but has been compliant with medication over the past 20 years, per the patient. She has been hospitalized for bipolar disorder and was suicidal in the past.   She used cocaine for 20-30 years, reportedly daily use. She was taken to the hospital several times for near-overdoses. She reports her last cocaine use was probably in 2001 (daughter is not so sure about this).  She also used alcohol heavily in the past. She reported that she was not drinking heavily for a long time but then several weeks ago she "bought a bottle" and drank it. Otherwise she states she has a cocktail sometimes with dinner.   She admits she has also abused pain medication (Tramadol) from the pain clinic, most recently just over a year ago. She no longer is prescribed this medication.  She has attended residential treatment facilities for substance abuse/dependence in the past. She also attended AA in the past.  She sees her psychiatrist regularly and she also is seeing a counselor now, approximately once a month.  She  denies any history of visual/auditory hallucinations.  Family psychiatric history is reportedly significant for bipolar disorder in her older sister and possible substance abuse (pills) in her sister.   Social History: Born/Raised: [place], [place] Education: 11th grade (had daughter at age 67)  Occupational history: Hairdresser for many years, now working as Research scientist (physical sciences) for [employer] Marital history: Divorced x3. One daughter. Alcohol: Reports occasional use now; reports remote history of heavy drinking. Tobacco: Former daily smoker, quit in 2017 SA: Denies current substance abuse. Reports remote history of daily cocaine use and marijuana use.   Medical History:  Past Medical History:  Diagnosis Date  . Asthma   . Bipolar 1 disorder ([place])   . COPD (chronic obstructive pulmonary disease) ([place])   . Diabetes ([place])   . DJD (degenerative joint disease)   . GERD (gastroesophageal reflux disease)   . Insomnia   . Transient cerebral ischemia     Current medications:  Outpatient Encounter Medications as of 10/12/2017  Medication Sig  . albuterol (PROVENTIL HFA;VENTOLIN HFA) 108 (90 Base) MCG/ACT inhaler Inhale 2 puffs into the lungs every 6 (six) hours as needed for wheezing or shortness of breath.  Marland Kitchen aspirin 325 MG tablet Take 325 mg by mouth daily.  Marland Kitchen atorvastatin (LIPITOR) 10 MG tablet Take 1 tablet by mouth daily.  Marland Kitchen  cyanocobalamin 500 MCG tablet Take 500 mcg by mouth 2 (two) times daily.  . famotidine (PEPCID) 20 MG tablet One at bedtime  . ferrous sulfate 325 (65 FE) MG tablet Take 325 mg by mouth daily with breakfast.  . IRON PO Take 1 tablet by mouth daily.  Marland Kitchen lamoTRIgine (LAMICTAL) 100 MG tablet Take 100 mg by mouth daily.  . mometasone-formoterol (DULERA) 100-5 MCG/ACT AERO Take 2 puffs first thing in am and then another 2 puffs about 12 hours later. (Patient taking differently: Take 2 puffs first thing in am and then another 2 puffs about 12 hours  later. Takes as needed)  . montelukast (SINGULAIR) 10 MG tablet Take 10 mg by mouth daily.  . Multiple Vitamin (MULTIVITAMIN) capsule Take 1 capsule by mouth daily.  Marland Kitchen OLANZapine (ZYPREXA) 20 MG tablet Take 20 mg by mouth 2 (two) times daily.  . pantoprazole (PROTONIX) 40 MG tablet Take 1 tablet (40 mg total) by mouth daily. Take 30-60 min before first meal of the day   No facility-administered encounter medications on file as of 10/12/2017.     Current Examination:  Behavioral Observations:  Appearance: Neatly, casually and appropriately dressed and groomed Gait: Ambulated independently, no gross abnormalities observed Speech: Fluent; normal rate, rhythm and volume. Repeats herself occasionally. Thought process: Generally linear Affect: Full, euthymic Interpersonal: Pleasant, appropriate, somewhat childish at times. Orientation: Oriented to person, place and most aspects of time (7 days off on the current date). Accurately named the current President and his predecessor.   Tests Administered: . Test of Premorbid Functioning (TOPF) . Wechsler Adult Intelligence Scale-Fourth Edition (WAIS-IV): Similarities, Block Design, Matrix Reasoning, Arithmetic, Coding, Symbol Search, Digit Span and Information subtests . [employer] Verbal Learning Test - 2nd Edition (CVLT-2) Short Form . Repeatable Battery for the Assessment of Neuropsychological Status (RBANS) Form A:  Figure Copy and Recall subtests; Story Memory and Story Recall subtests and Semantic Fluency subtest . Neuropsychological Assessment Battery (NAB) Language Module, Form 1: Naming subtest . Boston Diagnostic Aphasia Examination: Complex Ideational Material and Commands subtests . Controlled Oral Word Association Test (COWAT) . Trail Making Test A and B . Clock drawing test . Beck Depression Inventory - 2nd Edition (BDI-II) . Generalized Anxiety Disorder - 7 item screener (GAD-7)  Test Results: Note: Standardized scores are  presented only for use by appropriately trained professionals and to allow for any future test-retest comparison. These scores should not be interpreted without consideration of all the information that is contained in the rest of the report. The most recent standardization samples from the test publisher or other sources were used whenever possible to derive standard scores; scores were corrected for age, gender, ethnicity and education when available.   Test Scores:  Test Name Raw Score Standardized Score Descriptor  TOPF 22/70 SS= 81 Low average  WAIS-IV Subtests     Similarities 11/36 ss= 4 Impaired  Block Design 29/66 ss= 8 Average  Matrix Reasoning 9/26 ss= 6 Low average  Arithmetic 8/22 ss= 5 Borderline  Coding 46/135 ss= 7 Low average  Symbol Search 19/60 ss= 6 Low average  Digit Span  23/48 ss= 8 Average  Information 4/26 ss= 4 Impaired  WAIS Index Scores     Verbal Comprehension  SS= 66 Extremely low  Perceptual Reasoning  SS= 82 Low average  Working Memory  SS= 80 Low average  Processing Speed  SS= 81 Low average  Full Scale IQ (8 subtests)  SS= 73 Borderline  RBANS Subtests  Figure Copy 19/20 Z= 0.6 Average  Figure Recall 8/20 Z= -1.7 Borderline  Story Memory 4/24 Z= -3.7 Severely impaired  Story Recall 1/12 Z= -3.7 Severely impaired  Semantic Fluency 8 Z= -2.6 Severely impaired  CVLT-II Scores     Trial 1 4/9 Z= -1.5 Borderline  Trial 4 6/9 Z= -2.5 Impaired  Trials 1-4 total 17/36 T= 22 Severely impaired  SD Free Recall 4/9 Z= -2.5 Impaired  LD Free Recall 2/9 Z= -3 Severely impaired  LD Cued Recall 2/9 Z= -3.5 Severely impaired  Recognition Discriminability 5/9 hits, 1 false positives Z= -1.5 Borderline  Forced Choice Recognition 8/9  Impaired  NAB Naming 26/31 T= 32 Borderline  BDAE Complex Ideational Material 5/6    Commands 15/15  WNL  COWAT-FAS 11 T= 22 Severely impaired  COWAT-Animals 9 T= 24 Severely impaired  Trail Making Test A  37" 1 error T= 48  Average  Trail Making Test B  117" 2 errors T= 33 Borderline  Clock Drawing   WNL  BDI-II 2/63  WNL  GAD-7 1/21  WNL      Description of Test Results:  Premorbid verbal intellectual abilities were estimated to have been within the low average range based on a test of word reading. Current full scale IQ fell within the borderline range, and there was a significant difference among indices that comprise the FSIQ. Specifically, verbal comprehension (ie "verbal IQ) was significantly impaired relative to low average performance in all other indices.   Psychomotor processing speed was low average to average. Auditory attention and working memory were low average. Visual-spatial construction was average. Language abilities were below expectation. Specifically, confrontation naming was borderline impaired, and semantic verbal fluency was severely impaired. Auditory comprehension of complex ideational material was below expectation, but auditory comprehension of multi-step commands was intact. With regard to verbal memory, encoding and acquisition of non-contextual information (i.e., word list) was severely impaired. After a brief distracter task, free recall was impaired (4/9 items recalled). After a delay, free recall was severely impaired (2/9 items). Cued recall was severely impaired (did not recall any additional items). Performance on a yes/no recognition task was impaired due to low number of target items recognized. Performance on a forced choice recognition task also was impaired. On another verbal memory test, encoding and acquisition of contextual auditory information (i.e., short story) was severely impaired. After a delay, free recall was severely impaired (1/12 items recalled). With regard to non-verbal memory, delayed free recall of visual information was borderline impaired. Executive functioning was variable. Mental flexibility and set-shifting were borderline on Trails B and she committed two  set loss errors. Verbal fluency with phonemic search restrictions was severely impaired. Verbal abstract reasoning was impaired. Non-verbal abstract reasoning was low average. Performance on a clock drawing task was normal. On a self-report measures of mood, the patient's responses were not indicative of clinically significant depression or anxiety at the present time.    Clinical Impressions: Major neurocognitive disorder (ie dementia), likely multifactorial (small vessel disease, chronic lacunar infarction, h/o substance abuse/dependence, h/o bipolar disorder).  Results of cognitive testing revealed significant cognitive impairment in multiple domains relative to estimated premorbid baseline and relative to age-matched normative sample. Additionally, there is evidence that her cognitive deficits are interfering with her ability to manage complex tasks (eg driving new routes, managing appointments, working). As such, diagnostic criteria are met for major neurocognitive disorder, ie a dementia syndrome.  She demonstrated significant impairment in learning and memory (moreso for auditory than  visual information), verbal fluency, verbal abstract reasoning, and mental flexibility/set-shifting. Meanwhile, areas of intact function included auditory attention, processing speed, visual-spatial construction, and nonverbal abstract reasoning. It could be argued that this cognitive profile is lateralizing and suggestive of more left than right hemispheric involvement, which would be consistent with neuroimaging showing left hemisphere lacunar infarction. However, etiology of cognitive impairment is likely multifactorial, given her history of polysubstance abuse/dependence and bipolar disorder, both of which increase the risk of dementia. I also cannot rule out comorbid early onset Alzheimer's disease at this time, given the significant memory impairment on testing.  Fortunately, the patient is reporting stable mood with  no significant depression or mania. Her daughter reports increased anxiety on a daily basis which may be related to underlying dementia.    Recommendations/Plan: Based on the findings of the present evaluation, the following recommendations are offered:  1. The patient should limit driving to familiar, low-traffic locations during the day and avoid driving in inclement weather. Fortunately, her sister is starting to do more of the driving. 2. I recommended assistance with/oversight of medications to ensure mistakes are not being made. Continue assistance with appointments (family member should always attend with her), finances, meals.  3. Optimal control of vascular risk factors is recommended, although the patient reports she does not have diabetes (this is listed in her medical history in Epic).  4. Regular activities/schedule is important. She will likely have great difficulty learning new tasks and routines. 5. Re-evaluation in 1-2 years is recommended in order to monitor cognitive function, track any progression of symptoms and further assist with treatment planning. 6. Caregiver education and support is also recommended.  7. Continue mental health treatment. We spoke about the need to have clear goals in counseling/therapy treatment.   Feedback to Patient: Kimberly A Heldman and her daughter returned for a feedback appointment on 10/12/2017 to review the results of her neuropsychological evaluation with this provider. 35 minutes face-to-face time was spent reviewing her test results, my impressions and my recommendations as detailed above.    Total time spent on this patient's case: 95 minutes for neurobehavioral status exam with psychologist (CPT code [phone], [phone]); 90 minutes of testing/scoring by psychometrician under psychologist's supervision (CPT codes [phone], [phone] units); 180 minutes for integration of patient data, interpretation of standardized test results and clinical data,  clinical decision making, treatment planning and preparation of this report, and interactive feedback with review of results to the patient/family by psychologist (CPT codes [phone], [phone] units).      Thank you for your referral of Kimberly A Paules. Please feel free to contact me if you have any questions or concerns regarding this report.

## 2017-10-12 ENCOUNTER — Ambulatory Visit (INDEPENDENT_AMBULATORY_CARE_PROVIDER_SITE_OTHER): Payer: BLUE CROSS/BLUE SHIELD | Admitting: Psychology

## 2017-10-12 ENCOUNTER — Encounter: Payer: Self-pay | Admitting: Psychology

## 2017-10-12 DIAGNOSIS — I679 Cerebrovascular disease, unspecified: Secondary | ICD-10-CM

## 2017-10-12 DIAGNOSIS — I6381 Other cerebral infarction due to occlusion or stenosis of small artery: Secondary | ICD-10-CM

## 2017-10-12 DIAGNOSIS — F0390 Unspecified dementia without behavioral disturbance: Secondary | ICD-10-CM | POA: Diagnosis not present

## 2017-10-12 DIAGNOSIS — Z87898 Personal history of other specified conditions: Secondary | ICD-10-CM

## 2017-10-12 DIAGNOSIS — F1421 Cocaine dependence, in remission: Secondary | ICD-10-CM

## 2017-10-12 NOTE — Patient Instructions (Addendum)
Results of cognitive testing revealed significant cognitive impairment in multiple domains relative to estimated premorbid baseline and relative to age-matched normative sample. Additionally, there is evidence that her cognitive deficits are interfering with her ability to manage complex tasks (eg driving new routes, managing appointments, working). As such, diagnostic criteria are met for major neurocognitive disorder, ie a dementia syndrome.  She demonstrated significant impairment in learning and memory (moreso for auditory than visual information), verbal fluency, verbal abstract reasoning, and mental flexibility/set-shifting. Meanwhile, areas of intact function included auditory attention, processing speed, visual-spatial construction, and nonverbal abstract reasoning. It could be argued that this cognitive profile is lateralizing and suggestive of more left than right hemispheric involvement, which would be consistent with neuroimaging showing left hemisphere lacunar infarction. However, etiology of cognitive impairment is likely multifactorial, given her history of polysubstance abuse/dependence and bipolar disorder, both of which increase the risk of dementia. Fortunately, the patient is reporting stable mood with no significant depression or mania. Her daughter reports increased anxiety on a daily basis which may be related to underlying dementia.  Recommendations:  Assistance with medications (oversight to make sure they are being taken correctly). Continue assistance with appointments (family member should always attend with her), finances, meals.   Regular activity/schedule is important and should be maintained. She will have great difficulty learning new tasks and routines.  Re-evaluation in 1-2 years to monitor cognitive functioning/dementia is recommended.

## 2017-11-30 ENCOUNTER — Telehealth: Payer: Self-pay | Admitting: Internal Medicine

## 2017-11-30 MED ORDER — ALBUTEROL SULFATE HFA 108 (90 BASE) MCG/ACT IN AERS: 2.0000 | INHALATION_SPRAY | Freq: Four times a day (QID) | RESPIRATORY_TRACT | 5 refills | Status: DC | PRN

## 2017-11-30 NOTE — Telephone Encounter (Signed)
Script of Ventolin inhaler sent to pt's pharmacy.  Attempted to call pt to let her know this had been done but unable to reach pt.  Left detailed message for pt letting her know this had been taken care of for her. Nothing further needed at this time.

## 2018-01-22 ENCOUNTER — Other Ambulatory Visit: Payer: Self-pay | Admitting: Nurse Practitioner

## 2018-01-22 DIAGNOSIS — M1612 Unilateral primary osteoarthritis, left hip: Secondary | ICD-10-CM

## 2018-01-23 ENCOUNTER — Ambulatory Visit: Payer: BLUE CROSS/BLUE SHIELD | Admitting: Internal Medicine

## 2018-02-15 ENCOUNTER — Ambulatory Visit
Admission: RE | Admit: 2018-02-15 | Discharge: 2018-02-15 | Disposition: A | Discharge status: Home / Self Care | Payer: BLUE CROSS/BLUE SHIELD | Source: Ambulatory Visit | Attending: Nurse Practitioner | Admitting: Nurse Practitioner

## 2018-02-15 DIAGNOSIS — M1612 Unilateral primary osteoarthritis, left hip: Secondary | ICD-10-CM

## 2018-03-02 ENCOUNTER — Ambulatory Visit: Payer: BLUE CROSS/BLUE SHIELD | Admitting: Internal Medicine

## 2018-04-02 ENCOUNTER — Ambulatory Visit: Payer: BLUE CROSS/BLUE SHIELD | Admitting: Internal Medicine

## 2018-04-17 ENCOUNTER — Encounter: Payer: Self-pay | Admitting: Internal Medicine

## 2018-04-17 ENCOUNTER — Ambulatory Visit: Payer: BLUE CROSS/BLUE SHIELD | Admitting: Internal Medicine

## 2018-04-17 VITALS — BP 128/78 | HR 88 | Ht 64.0 in | Wt 179.4 lb

## 2018-04-17 DIAGNOSIS — J4530 Mild persistent asthma, uncomplicated: Secondary | ICD-10-CM | POA: Diagnosis not present

## 2018-04-17 MED ORDER — ALBUTEROL SULFATE HFA 108 (90 BASE) MCG/ACT IN AERS: 2.0000 | INHALATION_SPRAY | RESPIRATORY_TRACT | 2 refills | Status: AC | PRN

## 2018-04-17 MED ORDER — MOMETASONE FURO-FORMOTEROL FUM 100-5 MCG/ACT IN AERO: INHALATION_SPRAY | RESPIRATORY_TRACT | 11 refills | Status: DC

## 2018-04-17 NOTE — Assessment & Plan Note (Addendum)
Quit smoking 03/2016 - Spirometry 07/05/2016  FEV1 1.67 (62%)  Ratio 61 s rx day of ov and classic curvature   - Spirometry 07/26/2016  FEV1 1.76 (65%)  Ratio 71 with minimal curvature   - 07/26/2016  > try symbicort 80 2bid  - 06/13/2017  After extensive coaching HFA effectiveness =    90% > try dulera  2bid plus gerd rx  - PFT's  07/25/2017  FEV1 1.75 (68 % ) ratio 78  p 8 % improvement from saba p dulera 100 x 2 prior to study with DLCO  79/80 % corrects to 95  % for alv volume - minimal concavity to f/v loop     - 04/17/2018  After extensive coaching inhaler device,  effectiveness =    90%    All goals of chronic asthma control met including optimal function and elimination of symptoms with minimal need for rescue therapy on no maint rx  Contingencies discussed in full including contacting this office immediately if not controlling the symptoms using the rule of two's > if starts breaking this rule, 1st start back dulera 100 2bid and if still breaking it back to 2 bid and f/u here if not 100% better p a week    Comment: Based on two studies from NEJM  378; 20 p 1865 (2018) and 380 : p2020-30 (2019) in pts with mild asthma it is reasonable to use low dose symbicort eg 80 2bid(or the equivalent dulera 100)  "prn" flare in this setting but I emphasized this was only shown with symbicort(and was therefore  extrapolated reasonably likely  to work for [employer]) and takes advantage of the rapid onset of action but is not the same as "rescue therapy" but can be stopped once the acute symptoms have resolved and the need for rescue has been minimized (< 2 x weekly)   pulmonary f/u is yearly      I had an extended discussion with the patient reviewing all relevant studies completed to date and  lasting 15 to 20 minutes of a 25 minute visit    See device teaching which extended face to face time for this visit.  Each maintenance medication was reviewed in detail including emphasizing most importantly the  difference between maintenance and prns and under what circumstances the prns are to be triggered using an action plan format that is not reflected in the computer generated alphabetically organized AVS which I have not found useful in most complex patients, especially with respiratory illnesses  Please see AVS for specific instructions unique to this visit that I personally wrote and verbalized to the the pt in detail and then reviewed with pt  by my nurse highlighting any  changes in therapy recommended at today's visit to their plan of care.

## 2018-04-17 NOTE — Progress Notes (Signed)
Subjective:     Patient ID: Kimberly Ellison, female   DOB: Jun 02, 1956,    MRN: [id number]    Brief patient profile:  62   yowf quit smoking 04/04/16 with a h/o albuterol dependency for cough >sob dating  Back mid 90's and no better since quit smoking so referred to pulmonary clinic 07/05/2016 by Dr Boneta Lucks      History of Present Illness  07/05/2016 1st [facility] office visit/ Kimberly Ellison   Chief Complaint  Patient presents with  . Pulmonary Consult    Referred by Dr. Charlies Silvers. Pt c/o cough x 4 months.   dry cough x 20 years moved to [place] from [place] fall 2016 and worse than usual cough / need for albuterol since arrival  Cough worse at hs/ prilosec 20 mg daily after bfast/ once gets to sleep less likely to cough or wake in am coughing. Using lots of cough  Drops Mostly sob when coughing  rec  schedule sinus ct > neg   Instead of prilosec change to Protonix (pantoprazole) Take 30-60 min before first meal of the day and Pepcid 20 mg one bedtime plus chlorpheniramine 4 mg x 2 at bedtime (both available over the counter)  until cough is completely gone for at least a week without the need for cough suppression GERD diet  Please schedule a follow up office visit in 2 weeks, sooner if needed later with all active medications in hand     07/26/2016  f/u ov/Kimberly Ellison re: ASTHMA since birth, 2 saba  inhalers per month on max gerd rx / no meds in hand Chief Complaint  Patient presents with  . Follow-up    2 week follow up. Pt. states the coughing pills are helping. She has not coughed in a month.    felt needed saba w/in one hour of ov  rec Plan A = Automatic = Symbicort 80 (dulera 100) Take 2 puffs first thing in am and then another 2 puffs about 12 hours later.  Work on inhaler technique:   Plan B = Backup Only use your albuterol(proair)  as a rescue medication      09/06/2016  f/u ov/Kimberly Ellison re:   Copd vs asthma confused with meds / maintsymb 80 2bid/ ppi/ hs hs/ again did  not bring meds as req Chief Complaint  Patient presents with  . Follow-up    Pt stated her COPD is stable. Pt stated her cough has improved drastically and is very minimal at this time   still overusing saba/ confused re purpose of symb 80/ really no significant cough since last ov thoroughly confused with instructions Symptoms are daytime only never noct  rec Plan A = Automatic = Symbicort 80 Take 2 puffs first thing in am and then another 2 puffs about 12 hours later.  Also continue protonix (pantoprazole ) 40 mg Take 30-60 min before first meal of the day or Prilosec over the counter plus pepcid 20 mg at bedtime (also over the counter For drainage / throat tickle try take CHLORPHENIRAMINE  4 mg - take one every 4 hours as needed - available over the counter- may cause drowsiness so start with just a bedtime dose or two and see how you tolerate it before trying in daytime   Work on inhaler technique:  Plan B = Backup Only use your albuterol (PROAIR)  Please schedule a follow up office visit in 6 weeks, call sooner if needed with pfts first  - don't use your  proair w/in 4 hours of test and we will give it to you here > did not return as req       06/13/2017  Extended f/u ov/Kimberly Ellison re: re-establish now just on on singulair but still needing ventolin on arising each am  Chief Complaint  Patient presents with  . Follow-up    Breathing is unchanged. She has occ non prod cough. She is using ventolin 2 x per day on average.   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  Also continue protonix (pantoprazole ) 40 mg Take 30-60 min before first meal of the day or Prilosec over the counter plus pepcid 20 mg at bedtime (also over the counter For drainage / throat tickle try  take CHLORPHENIRAMINE  4 mg - take one every 4 hours as needed - available over the counter- may cause drowsiness so start with just a bedtime dose or two and see how you tolerate it before trying in daytime Work on maintaining perfect inhaler technique:    Plan B = Backup Only use your albuterol (PROAIR) as a rescue medication GERD (REFLUX) Please schedule a follow up office visit in 6 weeks, call sooner if needed with pfts first- do not use any albuterol  - you must bring all active inhalers and medications with you so we can verify the accuracy of our list - pfts on return     07/25/2017  f/u ov/Kimberly Ellison re:  AB on dulera 100 2bid Chief Complaint  Patient presents with  . Follow-up    PFT's done today. Her breathing has improved and she is using her albuterol inhaler 2-3 x per wk on average.   No  cough / much less need for saba now  Not limited by breathing from desired activities   No noct symptoms  rec No change rx     04/17/2018  f/u ov/Kimberly Ellison re: mild intermittent asthma no longer using dulera  Chief Complaint  Patient presents with  . Follow-up    6 mth f/u, hasnt used Dulera , uses albuterol when needed, sparingly  Dyspnea:  Not limited by breathing from desired activities   Cough: no Sleeping: no problem  flat SABA use: typicallly about twice weekly    No obvious day to day or daytime variability or assoc excess/ purulent sputum or mucus plugs or hemoptysis or cp or chest tightness, subjective wheeze or overt sinus or hb symptoms.   Sleeping as above  without nocturnal  or early am exacerbation  of respiratory  c/o's or need for noct saba. Also denies any obvious fluctuation of symptoms with weather or environmental changes or other aggravating or alleviating factors except as outlined above   No unusual exposure hx or h/o childhood pna/ asthma or knowledge of premature birth.  Current Allergies, Complete Past Medical History, Past Surgical History, Family History, and  Social History were reviewed in [facility] electronic medical record.  ROS  The following are not active complaints unless bolded Hoarseness, sore throat, dysphagia, dental problems, itching, sneezing,  nasal congestion or discharge of excess mucus or purulent secretions, ear ache,   fever, chills, sweats, unintended wt loss or wt gain, classically pleuritic or exertional cp,  orthopnea pnd or arm/hand swelling  or leg swelling, presyncope, palpitations, abdominal pain, anorexia, nausea, vomiting, diarrhea  or change in bowel habits or change in bladder habits, change in stools or change in urine, dysuria, hematuria,  rash, arthralgias, visual complaints, headache, numbness, weakness or ataxia or problems with walking or coordination,  change in mood or  memory.        Current Meds  Medication Sig  . albuterol (PROVENTIL HFA;VENTOLIN HFA) 108 (90 Base) MCG/ACT inhaler Inhale 2 puffs into the lungs every 4 (four) hours as needed for wheezing or shortness of breath.  Marland Kitchen aspirin 325 MG tablet Take 325 mg by mouth daily.  Marland Kitchen atorvastatin (LIPITOR) 10 MG tablet Take 1 tablet by mouth daily.  . cyanocobalamin 500 MCG tablet Take 500 mcg by mouth 2 (two) times daily.  . famotidine (PEPCID) 20 MG tablet One at bedtime  . IRON PO Take 1 tablet by mouth daily.  Marland Kitchen lamoTRIgine (LAMICTAL) 100 MG tablet Take 100 mg by mouth daily.  . montelukast (SINGULAIR) 10 MG tablet Take 10 mg by mouth daily.  . Multiple Vitamin (MULTIVITAMIN) capsule Take 1 capsule by mouth daily.  Marland Kitchen OLANZapine (ZYPREXA) 20 MG tablet Take 20 mg by mouth 2 (two) times daily.  Marland Kitchen  albuterol (PROVENTIL HFA;VENTOLIN HFA) 108 (90 Base) MCG/ACT inhaler Inhale 2 puffs into the lungs every 6 (six) hours as needed for wheezing or shortness of breath.                 Objective:   Physical Exam  amb wn nad   04/17/2018       179 07/25/2017       187  09/06/2016       183   07/26/16 181 lb (82.1 kg)  07/05/16 175 lb (79.4 kg)      Vital signs reviewed - Note on arrival 02 sats  96% on RA     HEENT: nl dentition, turbinates bilaterally, and oropharynx. Nl external ear canals without cough reflex   NECK :  without JVD/Nodes/TM/ nl carotid upstrokes bilaterally   LUNGS: no acc muscle use,  Nl contour chest which is clear to A and P bilaterally without cough on insp or exp maneuvers   CV:  RRR  no s3 or murmur or increase in P2, and no edema   ABD:  soft and nontender with nl inspiratory excursion in the supine position. No bruits or organomegaly appreciated, bowel sounds nl  MS:  Nl gait/ ext warm without deformities, calf tenderness, cyanosis or clubbing No obvious joint restrictions   SKIN: warm and dry without lesions    NEURO:  alert, approp, nl sensorium with  no motor or cerebellar deficits apparent.              Assessment:

## 2018-04-17 NOTE — Patient Instructions (Addendum)
Ok to hold off on using the dulera 100 unles you need the ventolin more than twice a week and if do then start with just two puffs of the dulera 100  X 2 puff in am and if still not doing well then use 2 pffs every 12 hours    Please schedule a follow up visit in 12  months but call sooner if needed

## 2018-08-22 DIAGNOSIS — T50901A Poisoning by unspecified drugs, medicaments and biological substances, accidental (unintentional), initial encounter: Secondary | ICD-10-CM

## 2018-08-22 DIAGNOSIS — B999 Unspecified infectious disease: Secondary | ICD-10-CM

## 2018-08-22 HISTORY — DX: Unspecified infectious disease: B99.9

## 2018-08-22 HISTORY — DX: Poisoning by unspecified drugs, medicaments and biological substances, accidental (unintentional), initial encounter: T50.901A

## 2018-10-10 ENCOUNTER — Ambulatory Visit: Payer: BLUE CROSS/BLUE SHIELD | Admitting: Neurology

## 2019-02-28 ENCOUNTER — Telehealth: Payer: Self-pay | Admitting: *Deleted

## 2019-02-28 NOTE — Telephone Encounter (Signed)
Noted thanks °

## 2019-02-28 NOTE — Telephone Encounter (Signed)
Called patient and spoke with her daughter, who agreed to make this change. Appointment is now 03/07/19 at 11:30.

## 2019-02-28 NOTE — Telephone Encounter (Signed)
Please call patient regarding Thurs 7/16 11:00 appt and r/s to 11:30 d/t scheduling conflict.

## 2019-03-07 ENCOUNTER — Other Ambulatory Visit: Payer: Self-pay

## 2019-03-07 ENCOUNTER — Ambulatory Visit: Payer: BC Managed Care – PPO | Admitting: Neurology

## 2019-03-07 ENCOUNTER — Encounter: Payer: Self-pay | Admitting: Neurology

## 2019-03-07 VITALS — BP 122/76 | HR 100 | Temp 98.4°F | Ht 65.5 in | Wt 176.0 lb

## 2019-03-07 DIAGNOSIS — F0281 Dementia in other diseases classified elsewhere with behavioral disturbance: Secondary | ICD-10-CM

## 2019-03-07 DIAGNOSIS — F02818 Dementia in other diseases classified elsewhere, unspecified severity, with other behavioral disturbance: Secondary | ICD-10-CM

## 2019-03-07 MED ORDER — DONEPEZIL HCL 5 MG PO TABS: 5.0000 mg | ORAL_TABLET | Freq: Every day | ORAL | 3 refills | Status: DC

## 2019-03-07 NOTE — Patient Instructions (Addendum)
Start Aricept (Donepezil).  Start Aricept 5mg  (start at 1/2 pill and increase in one week to whole pill). Email in a month for increase to 10mg . Drive only to familiar Locations close to home during the day Avoid driving during bad weather, after dark or on highways  Donepezil tablets What is this medicine? DONEPEZIL (doe NEP e zil) is used to treat mild to moderate dementia caused by Alzheimer's disease. This medicine may be used for other purposes; ask your health care provider or pharmacist if you have questions. COMMON BRAND NAME(S): Aricept What should I tell my health care provider before I take this medicine? They need to know if you have any of these conditions:  asthma or other lung disease  difficulty passing urine  head injury  heart disease  history of irregular heartbeat  liver disease  seizures (convulsions)  stomach or intestinal disease, ulcers or stomach bleeding  an unusual or allergic reaction to donepezil, other medicines, foods, dyes, or preservatives  pregnant or trying to get pregnant  breast-feeding How should I use this medicine? Take this medicine by mouth with a glass of water. Follow the directions on the prescription label. You may take this medicine with or without food. Take this medicine at regular intervals. This medicine is usually taken before bedtime. Do not take it more often than directed. Continue to take your medicine even if you feel better. Do not stop taking except on your doctor's advice. If you are taking the 23 mg donepezil tablet, swallow it whole; do not cut, crush, or chew it. Talk to your pediatrician regarding the use of this medicine in children. Special care may be needed. Overdosage: If you think you have taken too much of this medicine contact a poison control center or emergency room at once. NOTE: This medicine is only for you. Do not share this medicine with others. What if I miss a dose? If you miss a dose, take it as  soon as you can. If it is almost time for your next dose, take only that dose, do not take double or extra doses. What may interact with this medicine? Do not take this medicine with any of the following medications:  certain medicines for fungal infections like itraconazole, fluconazole, posaconazole, and voriconazole  cisapride  dextromethorphan; quinidine  dronedarone  pimozide  quinidine  thioridazine This medicine may also interact with the following medications:  antihistamines for allergy, cough and cold  atropine  bethanechol  carbamazepine  certain medicines for bladder problems like oxybutynin, tolterodine  certain medicines for Parkinson's disease like benztropine, trihexyphenidyl  certain medicines for stomach problems like dicyclomine, hyoscyamine  certain medicines for travel sickness like scopolamine  dexamethasone  dofetilide  ipratropium  NSAIDs, medicines for pain and inflammation, like ibuprofen or naproxen  other medicines for Alzheimer's disease  other medicines that prolong the QT interval (cause an abnormal heart rhythm)  phenobarbital  phenytoin  rifampin, rifabutin or rifapentine  ziprasidone This list may not describe all possible interactions. Give your health care provider a list of all the medicines, herbs, non-prescription drugs, or dietary supplements you use. Also tell them if you smoke, drink alcohol, or use illegal drugs. Some items may interact with your medicine. What should I watch for while using this medicine? Visit your doctor or health care professional for regular checks on your progress. Check with your doctor or health care professional if your symptoms do not get better or if they get worse. You may get drowsy or  dizzy. Do not drive, use machinery, or do anything that needs mental alertness until you know how this drug affects you. What side effects may I notice from receiving this medicine? Side effects that you  should report to your doctor or health care professional as soon as possible:  allergic reactions like skin rash, itching or hives, swelling of the face, lips, or tongue  feeling faint or lightheaded, falls  loss of bladder control  seizures  signs and symptoms of a dangerous change in heartbeat or heart rhythm like chest pain; dizziness; fast or irregular heartbeat; palpitations; feeling faint or lightheaded, falls; breathing problems  signs and symptoms of infection like fever or chills; cough; sore throat; pain or trouble passing urine  signs and symptoms of liver injury like dark yellow or brown urine; general ill feeling or flu-like symptoms; light-colored stools; loss of appetite; nausea; right upper belly pain; unusually weak or tired; yellowing of the eyes or skin  slow heartbeat or palpitations  unusual bleeding or bruising  vomiting Side effects that usually do not require medical attention (report to your doctor or health care professional if they continue or are bothersome):  diarrhea, especially when starting treatment  headache  loss of appetite  muscle cramps  nausea  stomach upset This list may not describe all possible side effects. Call your doctor for medical advice about side effects. You may report side effects to FDA at 1-800-FDA-1088. Where should I keep my medicine? Keep out of reach of children. Store at room temperature between 15 and 30 degrees C (59 and 86 degrees F). Throw away any unused medicine after the expiration date. NOTE: This sheet is a summary. It may not cover all possible information. If you have questions about this medicine, talk to your doctor, pharmacist, or health care provider.  2020 Elsevier/Gold Standard (2018-07-30 10:33:41)   Recommendations to prevent or slow progression of cognitive decline:   Exercise You should increase exercise 30 to 45 minutes per day at least 3 days a week although 5 to 7 would be preferred. Any  type of exercise (including walking) is acceptable although a recumbent bicycle may be best if you are unsteady. Disease related apathy can be a significant roadblock to exercise and the only way to overcome this is to make it a daily routine and perhaps have a reward at the end (something your loved one loves to eat or drink perhaps) or a personal trainer coming to the home can also be very useful. In general a structured, repetitive schedule is best.   Cardiovascular Health: You should optimize all cardiovascular risk factors (blood pressure, sugar, cholesterol) as vascular disease such as strokes and heart attacks can make memory problems much worse.   Diet: Eating a heart healthy (Mediterranean) diet is also a good idea; fish and poultry instead of red meat, nuts (mostly non-peanuts), vegetables, fruits, olive oil or canola oil (instead of butter), minimal salt (use other spices to flavor foods), whole grain rice, bread, cereal and pasta and wine in moderation.  General Health: Any diseases which effect your body will effect your brain such as a pneumonia, urinary infection, blood clot, heart attack or stroke. Keep contact with your primary care doctor for regular follow ups.  Sleep. A good nights sleep is healthy for the brain. Seven hours is recommended. If you have insomnia or poor sleep habits see the recommendations below  Tips: Structured and consistent daytime and nighttime routine, including regular wake times, bedtimes, and mealtimes, will  be important for the patient to avoid confusion. Keeping frequently used items in designated places will help reduce stress from searching. If there are worries about getting lost do not let the patient leave home unaccompanied. They might benefit from wearing an identification bracelet that will help others assist in finding home if they become lost. Information about nationwide safe return services and other helpful resources may be obtained through the  Alzheimer's Association helpline at [phone].  Finances, Power of Producer, television/film/video Directives: You should consider putting legal safeguards in place with regard to financial and medical decision making. While the spouse always has power of attorney for medical and financial issues in the absence of any form, you should consider what you want in case the spouse / caregiver is no longer around or capable of making decisions.   [place] : http://www.welch.com/.pdf  Or Google "[place]" AND "An Forensic scientist for [employer]  Other States: ApartmentMom.com.ee  The signature on these forms should be notarized.   DRIVING:   Driving only during the day Drive only to familiar Locations Avoid driving during bad weather  If you would like to be tested to see if you are driving safely, [facility] has a Clinical Driving Evaluation. To schedule an appointment call [phone].                RESOURCES:  Memory Loss: Improve your short term memory By Silvio Pate  The Alzheimer's Reading Room http://www.alzheimersreadingroom.com/   The Alzheimer's Compendium http://www.alzcompend.info/  [employer] www.dukefamilysupport.UVO ([phone]  Recommended resources for caregivers (All can be purchased on [employer]):  1) A Caregiver's Guide to Dementia: Using Activities and Other Strategies to Prevent, Reduce and Manage Behavioral Symptoms by Osie Bond. Tyler Aas and [employer]   2) A Caregiver's Guide to [employer] Dementia by Caleen Essex MS BSN and Gaston Islam   3) What If It's Not Alzheimer's?: A Caregiver's Guide to Dementia by Koren Shiver (Author), Octaviano Batty (Editor)  3) The 36 hour day by Rabins and Mace  4) Understanding Difficult Behaviors by Merita Norton and White  Online course for helping  caregivers reduce stress, guilt and frustration called the Caregivers Helpbook. The website is www.powerfultoolsforcaregivers.org  As a caregiver you are a Art gallery manager. Problems you face as a caregiver are usually unique to your situation and the way your loved-one's disease manifests itself. The best way to use these books is to look at the Table of Contents and read any chapters of interest or that apply to challenges you are having as a caregiver.  NATIONAL RESOURCES: For more information on neurological disorders or research programs funded by the [employer] of Neurological Disorders and Stroke, contact the Institute's Agricultural consultant (BRAIN) at: [place] [address] [phone] (toll-free) MasterBoxes.it  Information on dementia is also available from the following organizations: Alzheimer's Disease Education and Referral ([place]) [place] [facility] [address] [place] [address] [phone] (toll-free) DVDEnthusiasts.nl  Alzheimer's Association [address] [place] [address] [phone] (toll-free, 24-hour helpline) [phone] (TDD) CapitalMile.co.nz  Alzheimer's Foundation of America [address], [place] [address] [phone] (toll-free) www.alzfdn.org  [facility] [address] [place] [address] [phone] www.alzdiscovery.org  Association for [address], [place] [address] [phone] (toll-free) www[place] [address]  [phone] (toll-free) www.brightfocus.org/alzheimers  [facility]ala[facility] Alzheimer's [facility] [address], [address] [place], [place] [id number] [phone] www.BushWebsites.nljdfaf.org  Lewy Body Dementia  Association [address], [place], [place] [id number] [phone] (toll-free LBD Caregiver Link) www.lbda.[facility]  National Institute of Mental Health [address], [address] [place], [place] [phone] (toll-free) [phone] [facility](TTY) http://www.maynard.net/www.nimh.nih.gov  National Organization for Rare Disorders [address] [place], [place] [id number]-[phone]) (toll-free) www.rarediseases.org  The Dementias: Hope Through Research was jointly produced by the [employer] Institute of Neurological Disorders and Stroke (NINDS) and the [employer] Institute on Aging (NIA), both part of the [employer] Pet[employer]ational Institutes of Health, the H. J. Heinznation's medical research agency-supporting scientific studies that turn discovery into health. NINDS is the nation's leading funder of research on the brain and nervous system. The NINDS mission is to reduce the burden of neurological disease. For more information and resources, visit ToledoAutomobile.co.ukwww.ninds.nih.gov [1] or call [phone]. NIA leads the federal government effort conducting and supporting research on aging and the health and well-being of older people. NIA's Alzheimer's Disease Education and Referral (ADEAR) Center offers information and publications on dementia and caregiving for families, caregivers, and professionals. For more information, visit CashCowGambling.bewww.nia.nih.gov/alzheimers [2] or call [phone]. Also available from NIA are publications and information about Alzheimer's disease as well as the booklets Frontotemporal Disorders: Information for Patients, Families, and Caregivers and Lewy Body Dementia: Information for Patients, Families, and Professionals. Source URL: VoiceZap.ishttp://www.nia.nih.gov/alzheimers/publication/dementias/resources

## 2019-03-07 NOTE — Progress Notes (Signed)
[facility]    Provider:  Dr Jaynee Eagles Referring Provider: Dewayne Shorter, [place]* Primary Care Physician:  Dewayne Shorter, PA-C  CC:  Memory difficulty  Interval history: 63 year old with Dementia by formal memory testing.  Likely multifactorial with small vessel disease lacunar infarcts, substance abuse and bipolar disorder. Here with daughter who provides most information. She lives with her sister. Patient thinks everything is fine.  Her sister became ill and patient was very stressed, they eat poorly "like children" per daughter.  She overdosed on benzodiazepines in January. She was admitted into the hospital and took 5 days for her to be coherent. Daughter has now taken control of her finances and medications, and she had her pain medications stopped (cannot go back to [facility]).  Mother and daughter have a better understanding now that she has been diagnosed with dementia formally and daughter has taken over the bills. Her short-term is still poor and retention for new things.  We reviewed the report from formal neurocognitive testing today.  Interval history 10/09/2017: Memory is stable. Everything is the same. Met with Dr. Si Raider on the the 5th pending follow up. No progression of the memory loss. No new symptoms. She now lives with her sister, having a great time. Sister has bipolar disorder. Neither parents had bipolar disorder. Dad with alzheimers died at 57.  Reviewed imaging of the brain with patient, showed her images, discussed stroke.  This MRI of the brain with and without contrast shows the following: 1.   Small left hemispheric focus consistent with a small chronic lacunar infarction. There are also 2 T2/FLAIR punctate hyperintense foci in the right cerebellar hemisphere and right hemisphere consistent with minimal chronic microvascular ischemic changes. None of these appear to be acute. 2.    There is a normal enhancement pattern and there  are no acute findings.   HPI:  [place] A Diggins is a 63 y.o. female here as a referral from Dr. Antony Salmon for memory problems. Past medical history of bipolar disorder on Lamictal, cocaine addiction, depression, diabetes, anxiety, insomnia, marijuana dependence, suicidal ideation, TIA, COPD, HLD, previous smoker. She is here with daughter who provides much information. She divorced 8 years ago and moved in with a relative. 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 Mother passed away 2 years ago and patient moved to this area, they had to have a calendar and keep notes (aunt helped patient). More short term memory, dates, names, recent conversations, repeats things she says and asks the same questions in the same day multiple times over and over in the same day. 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 She can't manage the printer or put paper in it, forgets daily. Slowly progresses but waxes and wanes as well. Patient forgot her son-in-law's birthday and had enough, thought it was time to be evaluated because everyone has told her for years that she is having memory problems. Father had dementia died at 89 and had dementia at least 6 years prior to death, he was a  smoker but no known use of substance abuse in her family. Mother developed memory loss in her 27s. Patient has blank stares, alteration of awareness. She does not respond initially. Patient is completely aware when she is staring per patient, she just doesn't doesn't answer. No hx of seizure. She is on  medication for bipolar disorder and says she is well controlled but daughter believes she has more depression than she says she does, she has manic states and most recently this past July called daughter crying saying she couldn't handle things and there is major anxiety and stress ongoing.   Reviewed notes, labs and imaging from outside physicians, which showed:  Reviewed primary care notes and notes from [facility]. She presented with memory issues. Becoming more forgetful for patient. That short-term memory for several years per daughter. Daughter states patient is worried about getting lost in developing memory issues. She has to tell the patient several times how to put paper copy her work and had to do other things at work. Mostly short-term memory. Patient's mother and father both had dementia. Patient also gets a blank stare and her face and doesn't remember anything happening. She drinks 2-4 L of Diet Coke every day. She is treated by psychiatry for bipolar disorder. She performs all her own ADLs and drives just worried about getting lost. MMSE was 27 out of 30 05/09/2017.     Review of Systems: Patient complains of symptoms per HPI as well as the following symptoms: Increased thirst, joint pain, shortness of breath, cough, wheezing, snoring, hearing loss. Pertinent negatives and positives per HPI. All others negative.   Social History   Socioeconomic History   Marital status: Divorced    Spouse name: Not on file   Number of children: 1   Years of education: 10   Highest education level: Not on file  Occupational History    Comment: [employer]  Social Designer, fashion/clothing strain: Not on file   Food insecurity    Worry: Not on file    Inability: Not on file   Transportation needs    Medical: Not on file    Non-medical: Not on file  Tobacco Use   Smoking status: Former Smoker    Packs/day: 2.00    Years: 46.00    Pack years: 92.00    Types: Cigarettes    Quit  date: 04/04/2016    Years since quitting: 2.9   Smokeless tobacco: Never Used  Substance and Sexual Activity   Alcohol use: No   Drug use: No    Comment: hx cocaine, marijuana    Sexual activity: Not on file  Lifestyle   Physical activity    Days per week: Not on file    Minutes per session: Not on file   Stress: Not on file  Relationships   Social connections    Talks on phone: Not on file    Gets together: Not on file    Attends religious service: Not on file    Active member of club or organization: Not on file    Attends meetings of clubs or organizations: Not on file    Relationship status: Not on file   Intimate partner violence    Fear of current or ex partner: Not on file    Emotionally abused: Not on file    Physically abused: Not on file    Forced sexual activity: Not on file  Other Topics Concern   Not on file  Social History Narrative   Lives alone. Sister lives with her and the patient takes care of her sister.    Drinks 2-4 liters Diet Coke daily   Right handed    Family History  Problem Relation Age of Onset   Lymphoma Mother    Hypertension Mother    Stroke Mother    Hypertension Sister    Diabetes Sister    Diabetes Daughter     Past Medical History:  Diagnosis Date   Asthma    Bipolar 1 disorder ([place])    Constipation    COPD (chronic obstructive pulmonary disease) (HCC)    Diabetes ([place])    DJD (degenerative joint disease)    GERD (gastroesophageal reflux disease)    Infection 2020   stomach infection   Insomnia    Overdose 08/2018   per daughter, hospitalized at [facility]. unsure if intentional.   Transient cerebral ischemia     Past Surgical History:  Procedure Laterality Date   ABDOMINAL HYSTERECTOMY  1999   WISDOM TOOTH EXTRACTION      Current Outpatient Medications  Medication Sig Dispense Refill   albuterol (PROVENTIL HFA;VENTOLIN HFA) 108 (90 Base) MCG/ACT inhaler Inhale 2 puffs into the lungs every  4 (four) hours as needed for wheezing or shortness of breath. 1 Inhaler 2   aspirin 325 MG tablet Take 325 mg by mouth daily.     atorvastatin (LIPITOR) 10 MG tablet Take 1 tablet by mouth daily.  1   famotidine (PEPCID) 20 MG tablet One at bedtime 30 tablet 2   lamoTRIgine (LAMICTAL) 100 MG tablet Take 150 mg by mouth daily.      Melatonin 3 MG TABS Take 3 mg by mouth at bedtime.     mirtazapine (REMERON) 7.5 MG tablet Take 7.5 mg by mouth at bedtime.     montelukast (SINGULAIR) 10 MG tablet Take 10 mg by mouth daily.     Multiple Vitamin (MULTIVITAMIN) capsule Take 1 capsule by mouth daily.     OLANZapine (ZYPREXA) 20 MG tablet Take 20 mg by mouth 2 (two) times daily.     cyanocobalamin 500 MCG tablet Take 500 mcg by mouth 2 (two) times daily.     donepezil (ARICEPT) 5 MG tablet Take 1 tablet (5 mg total) by mouth at bedtime. 30 tablet 3   pantoprazole (PROTONIX) 40 MG tablet Take 1 tablet (40 mg total) by mouth daily. Take 30-60 min before first meal of the day (Patient not taking: Reported on 04/17/2018) 30 tablet 2   No current facility-administered medications for this visit.     Allergies as of 03/07/2019 - Review Complete 03/07/2019  Allergen Reaction Noted   Penicillins Hives 07/05/2016    Vitals: BP 122/76 (BP Location: Right Arm, Patient Position: Sitting)    Pulse 100    Temp 98.4 F (36.9 C) Comment: daughter 97.3, both taken by front staff   Ht 5' 5.5" (1.664 m)    Wt 176 lb (79.8 kg)    BMI 28.84 kg/m  Last Weight:  Wt Readings from Last 1 Encounters:  03/07/19 176 lb (79.8 kg)   Last Height:   Ht Readings from Last 1 Encounters:  03/07/19 5' 5.5" (1.664 m)  Physical exam: Exam: Gen: NAD, conversant, well nourised, obese, well groomed                     CV: RRR, no MRG. No Carotid Bruits. No peripheral edema, warm, nontender Eyes: Conjunctivae clear without exudates or  hemorrhage  Neuro: Detailed Neurologic Exam  Speech:    Speech is normal;  fluent and spontaneous with normal comprehension.  Cognition:  MMSE - Mini Mental State Exam 05/12/2017  Orientation to time 3  Orientation to Place 3  Registration 3  Attention/ Calculation 5  Recall 1  Language- name 2 objects 2  Language- repeat 0  Language- follow 3 step command 3  Language- read & follow direction 1  Write a sentence 1  Copy design 0  Total score 22      The patient is oriented to person, place, and time;     recent and remote memory intact;     language fluent;     Impaired attention, concentration, fund of knowledge Cranial Nerves:    The pupils are equal, round, and reactive to light. Attempted fundoscopic exam could not visualize. . Visual fields are full to finger confrontation. Extraocular movements are intact. Trigeminal sensation is intact and the muscles of mastication are normal. The face is symmetric. The palate elevates in the midline. Hearing intact. Voice is normal. Shoulder shrug is normal. The tongue has normal motion without fasciculations.   Coordination:    Normal finger to nose and heel to shin. Normal rapid alternating movements.   Gait:    Heel-toe and tandem gait are normal.   Motor Observation:    No asymmetry, no atrophy, and no involuntary movements noted. Tone:    Normal muscle tone.    Posture:    Posture is normal. normal erect    Strength:    Strength is V/V in the upper and lower limbs.      Sensation: intact to LT     Reflex Exam:  DTR's:    Deep tendon reflexes in the upper and lower extremities are normal bilaterally.   Toes:    The toes are downgoing bilaterally.   Clonus:    Clonus is absent.        Assessment/Plan:  63 year old with Dementia by formal memory testing.  Likely multifactorial with small vessel disease lacunar infarcts, substance abuse and bipolar disorder. FHx of dementia in both parents. Complicated by a history of substance abuse including cocaine, marijuana, alcohol (15 years ago) and  more recent medication abuse of Tramadol. No alcohol use, maybe an ocassional drink when they eat. No medication overuse for one year. She lives with daughter and works at [employer] so she is monitored. Last substance abuse was earlier this year when she overdosed. Recommended f/u with psychiatry and therapy for psychiatric illnesses.  Daughter would like repeat testing next February which would be 2 years.  At this point I do not think it will change any clinical outcome or treatment however daughter requests it.  Dr. Si Raider is no longer here will send to Dr. Loman Chroman.  Start Aricept 45m (start at 1/2 pill and increase in one week to whole pill). Email in a month for increase to 11mand daughter agrees to email me. Stop for any side effects, reviewed in detail..  Discussed lacunar infarct again with patient and daughter. Discussed with patient and daughter. ASA 32514mor secondary stroke prevention and maintain strict control of hypertension with blood pressure goal below 130/90, diabetes with hemoglobin A1c goal below 6.5% and lipids with LDL cholesterol goal below 70 mg/dL I also advised the patient to eat a healthy diet with plenty of whole grains, cereals, fruits and vegetables, exercise regularly and maintain ideal body weight .Followup in the future with  me after formal neurocognitive testing and also ordered cognitive therapy.  Orders Placed This Encounter  Procedures   Ambulatory referral to Neuropsychology   Meds ordered this encounter  Medications   donepezil (ARICEPT) 5 MG tablet    Sig: Take 1 tablet (5 mg total) by mouth at bedtime.    Dispense:  30 tablet    Refill:  3   A total of 45 minutes was spent face-to-face with this patient. Over half this time was spent on counseling patient on the  1. Dementia associated with other underlying disease with behavioral disturbance ([place])    diagnosis and different diagnostic and therapeutic options, counseling and coordination of  care, risks ans benefits of management, compliance, or risk factor reduction and education.     Cc: Dewayne Shorter, PA-C  Sarina Ill, MD  [facility] [address] [place], [place] [address]  Phone [phone] Fax [phone]  A total of 25 minutes was spent in with this patient. Over half this time was spent on counseling patient on the lacunar stroke, cognitive deficits diagnosis and different therapeutic options available.

## 2019-03-11 DIAGNOSIS — F02818 Dementia in other diseases classified elsewhere, unspecified severity, with other behavioral disturbance: Secondary | ICD-10-CM | POA: Insufficient documentation

## 2019-03-11 DIAGNOSIS — F0281 Dementia in other diseases classified elsewhere with behavioral disturbance: Secondary | ICD-10-CM | POA: Insufficient documentation

## 2019-04-01 ENCOUNTER — Encounter: Payer: Self-pay | Admitting: Psychology

## 2019-06-12 ENCOUNTER — Other Ambulatory Visit: Payer: Self-pay | Admitting: Internal Medicine

## 2019-07-13 ENCOUNTER — Other Ambulatory Visit: Payer: Self-pay | Admitting: Neurology

## 2019-07-29 ENCOUNTER — Encounter: Payer: BC Managed Care – PPO | Admitting: Psychology

## 2019-08-12 ENCOUNTER — Other Ambulatory Visit: Payer: Self-pay | Admitting: *Deleted

## 2019-08-12 MED ORDER — DONEPEZIL HCL 5 MG PO TABS: 5.0000 mg | ORAL_TABLET | Freq: Every day | ORAL | 0 refills | Status: DC

## 2019-10-29 ENCOUNTER — Ambulatory Visit: Payer: BC Managed Care – PPO | Admitting: Adult Health

## 2019-10-29 ENCOUNTER — Encounter: Payer: Self-pay | Admitting: Adult Health

## 2019-10-29 ENCOUNTER — Other Ambulatory Visit: Payer: Self-pay

## 2019-10-29 VITALS — BP 121/77 | HR 87 | Temp 97.2°F | Ht 65.5 in | Wt 175.6 lb

## 2019-10-29 DIAGNOSIS — F0281 Dementia in other diseases classified elsewhere with behavioral disturbance: Secondary | ICD-10-CM | POA: Diagnosis not present

## 2019-10-29 DIAGNOSIS — F02818 Dementia in other diseases classified elsewhere, unspecified severity, with other behavioral disturbance: Secondary | ICD-10-CM

## 2019-10-29 MED ORDER — DONEPEZIL HCL 10 MG PO TABS: 10.0000 mg | ORAL_TABLET | Freq: Every day | ORAL | 5 refills | Status: DC

## 2019-10-29 NOTE — Patient Instructions (Signed)
Your Plan:   Memory score is stable Increase Aricept to 10 mg at bedtime If your symptoms worsen or you develop new symptoms please let us know.   Thank you for coming to see us at [facility]. I hope we have been able to provide you high quality care today.  You may receive a patient satisfaction survey over the next few weeks. We would appreciate your feedback and comments so that we may continue to improve ourselves and the health of our patients.  

## 2019-10-29 NOTE — Progress Notes (Addendum)
PATIENT: Kimberly Ellison DOB: 09-09-1955  REASON FOR VISIT: follow up HISTORY FROM: patient  HISTORY OF PRESENT ILLNESS: Today 10/29/19:  Kimberly Ellison is a 64 year old female with a history of memory disturbance.  She returns today for follow-up.  She is with her daughter.  She feels that her memory has remained the same.  She lives with her sister.  Able to complete all ADLs independently.  Her daughter manages her medications, appointments and finances.  Daughter feels that her mood is better.  Patient reports that she does some cooking.  She has tolerated Aricept 5 mg at bedtime.  HISTORY (copied from Dr. Cathren Laine note) 64 year old with Dementia by formal memory testing.  Likely multifactorial with small vessel disease lacunar infarcts, substance abuse and bipolar disorder. Here with daughter who provides most information. She lives with her sister. Patient thinks everything is fine.  Her sister became ill and patient was very stressed, they eat poorly "like children" per daughter.  She overdosed on benzodiazepines in January. She was admitted into the hospital and took 5 days for her to be coherent. Daughter has now taken control of her finances and medications, and she had her pain medications stopped (cannot go back to [facility]).  Mother and daughter have a better understanding now that she has been diagnosed with dementia formally and daughter has taken over the bills. Her short-term is still poor and retention for new things.  We reviewed the report from formal neurocognitive testing today.  REVIEW OF SYSTEMS: Out of a complete 14 system review of symptoms, the patient complains only of the following symptoms, and all other reviewed systems are negative.  See HPI  ALLERGIES: Allergies  Allergen Reactions  . Penicillins Hives    HOME MEDICATIONS: Outpatient Medications Prior to Visit  Medication Sig Dispense Refill  . albuterol (PROVENTIL HFA;VENTOLIN HFA) 108 (90  Base) MCG/ACT inhaler Inhale 2 puffs into the lungs every 4 (four) hours as needed for wheezing or shortness of breath. 1 Inhaler 2  . aspirin 325 MG tablet Take 325 mg by mouth daily.    Marland Kitchen atorvastatin (LIPITOR) 10 MG tablet Take 1 tablet by mouth daily.  1  . cyanocobalamin 500 MCG tablet Take 500 mcg by mouth 2 (two) times daily.    Marland Kitchen donepezil (ARICEPT) 5 MG tablet Take 1 tablet (5 mg total) by mouth at bedtime. 30 tablet 0  . famotidine (PEPCID) 20 MG tablet One at bedtime 30 tablet 2  . lamoTRIgine (LAMICTAL) 100 MG tablet Take 150 mg by mouth daily.     . Melatonin 3 MG TABS Take 3 mg by mouth at bedtime.    . mirtazapine (REMERON) 7.5 MG tablet Take 7.5 mg by mouth at bedtime.    . montelukast (SINGULAIR) 10 MG tablet Take 10 mg by mouth daily.    . Multiple Vitamin (MULTIVITAMIN) capsule Take 1 capsule by mouth daily.    Marland Kitchen OLANZapine (ZYPREXA) 20 MG tablet Take 20 mg by mouth 2 (two) times daily.    . pantoprazole (PROTONIX) 40 MG tablet Take 1 tablet (40 mg total) by mouth daily. Take 30-60 min before first meal of the day 30 tablet 2   No facility-administered medications prior to visit.    PAST MEDICAL HISTORY: Past Medical History:  Diagnosis Date  . Asthma   . Bipolar 1 disorder ([place])   . Constipation   . COPD (chronic obstructive pulmonary disease) ([place])   . Diabetes ([place])   .  DJD (degenerative joint disease)   . GERD (gastroesophageal reflux disease)   . Infection 2020   stomach infection  . Insomnia   . Overdose 08/2018   per daughter, hospitalized at [facility]. unsure if intentional.  . Transient cerebral ischemia     PAST SURGICAL HISTORY: Past Surgical History:  Procedure Laterality Date  . ABDOMINAL HYSTERECTOMY  1999  . WISDOM TOOTH EXTRACTION      FAMILY HISTORY: Family History  Problem Relation Age of Onset  . Lymphoma Mother   . Hypertension Mother   . Stroke Mother   . Hypertension Sister   . Diabetes Sister   . Diabetes Daughter     SOCIAL  HISTORY: Social History   Socioeconomic History  . Marital status: Divorced    Spouse name: Not on file  . Number of children: 1  . Years of education: 54  . Highest education level: Not on file  Occupational History    Comment: [employer]  Tobacco Use  . Smoking status: Former Smoker    Packs/day: 2.00    Years: 46.00    Pack years: 92.00    Types: Cigarettes    Quit date: 04/04/2016    Years since quitting: 3.5  . Smokeless tobacco: Never Used  Substance and Sexual Activity  . Alcohol use: No  . Drug use: No    Comment: hx cocaine, marijuana   . Sexual activity: Not on file  Other Topics Concern  . Not on file  Social History Narrative   Lives alone. Sister lives with her and the patient takes care of her sister.    Drinks 2-4 liters Diet Coke daily   Right handed   Social Determinants of Health   Financial Resource Strain:   . Difficulty of Paying Living Expenses: Not on file  Food Insecurity:   . Worried About Programme researcher, broadcasting/film/video in the Last Year: Not on file  . Ran Out of Food in the Last Year: Not on file  Transportation Needs:   . Lack of Transportation (Medical): Not on file  . Lack of Transportation (Non-Medical): Not on file  Physical Activity:   . Days of Exercise per Week: Not on file  . Minutes of Exercise per Session: Not on file  Stress:   . Feeling of Stress : Not on file  Social Connections:   . Frequency of Communication with Friends and Family: Not on file  . Frequency of Social Gatherings with Friends and Family: Not on file  . Attends Religious Services: Not on file  . Active Member of Clubs or Organizations: Not on file  . Attends Banker Meetings: Not on file  . Marital Status: Not on file  Intimate Partner Violence:   . Fear of Current or Ex-Partner: Not on file  . Emotionally Abused: Not on file  . Physically Abused: Not on file  . Sexually Abused: Not on file      PHYSICAL EXAM  Vitals:   10/29/19 1123  BP:  121/77  Pulse: 87  Temp: (!) 97.2 F (36.2 C)  TempSrc: Oral  Weight: 175 lb 9.6 oz (79.7 kg)  Height: 5' 5.5" (1.664 m)   Body mass index is 28.78 kg/m.   MMSE - Mini Mental State Exam 10/29/2019 05/12/2017  Not completed: (No Data) -  Orientation to time 5 3  Orientation to Place 3 3  Registration 3 3  Attention/ Calculation 5 5  Recall 2 1  Language- name 2 objects 2  2  Language- repeat 1 0  Language- follow 3 step command 3 3  Language- read & follow direction 1 1  Write a sentence 1 1  Copy design 1 0  Total score 27 22     Generalized: Well developed, in no acute distress   Neurological examination  Mentation: Alert oriented to time, place, history taking. Follows all commands speech and language fluent Cranial nerve II-XII: Pupils were equal round reactive to light. Extraocular movements were full, visual field were full on confrontational test. . Head turning and shoulder shrug  were normal and symmetric. Motor: The motor testing reveals 5 over 5 strength of all 4 extremities. Good symmetric motor tone is noted throughout.  Sensory: Sensory testing is intact to soft touch on all 4 extremities. No evidence of extinction is noted.  Coordination: Cerebellar testing reveals good finger-nose-finger and heel-to-shin bilaterally.  Gait and station: Gait is normal. Reflexes: Deep tendon reflexes are hyperreflexic in the left upper and lower extremity  DIAGNOSTIC DATA (LABS, IMAGING, TESTING) - I reviewed patient records, labs, notes, testing and imaging myself where available.  Lab Results  Component Value Date   WBC 6.8 05/12/2017   HGB 12.7 05/12/2017   HCT 38.7 05/12/2017   MCV 93 05/12/2017   PLT 293 05/12/2017      Component Value Date/Time   NA 134 05/12/2017 1226   K 4.8 05/12/2017 1226   CL 98 05/12/2017 1226   CO2 22 05/12/2017 1226   GLUCOSE 86 05/12/2017 1226   BUN 7 05/12/2017 1226   CREATININE 0.87 05/12/2017 1226   CALCIUM 9.4 05/12/2017 1226    PROT 6.5 05/12/2017 1226   ALBUMIN 4.6 05/12/2017 1226   AST 14 05/12/2017 1226   ALT 8 05/12/2017 1226   ALKPHOS 113 05/12/2017 1226   BILITOT 0.2 05/12/2017 1226   GFRNONAA 73 05/12/2017 1226   GFRAA 84 05/12/2017 1226   No results found for: CHOL, HDL, LDLCALC, LDLDIRECT, TRIG, CHOLHDL No results found for: BZJI9C Lab Results  Component Value Date   VITAMINB12 406 05/12/2017   Lab Results  Component Value Date   TSH 2.690 05/12/2017      ASSESSMENT AND PLAN 64 y.o. year old female  has a past medical history of Asthma, Bipolar 1 disorder (HCC), Constipation, COPD (chronic obstructive pulmonary disease) (HCC), Diabetes (HCC), DJD (degenerative joint disease), GERD (gastroesophageal reflux disease), Infection (2020), Insomnia, Overdose (08/2018), and Transient cerebral ischemia. here with:  1.  Dementia  -MMSE is stable 27 out of 30 -Increase Aricept to 10 mg at bedtime--reviewed potential side effects with the patient and her daughter -Advised if symptoms worsen or she develops new symptoms she should let us know -Follow-up in 6 months or sooner if needed   I spent 15 minutes of face-to-face and non-face-to-face time with patient.  This included previsit chart review, lorder entry, electronic health record documentation, patient education.  Butch Penny, MSN, NP-C 10/29/2019, 11:36 AM [facility] [address], [address] [place], [place] [address] [phone]  Made any corrections needed, and agree with history, physical, neuro exam,assessment and plan as stated.     Naomie Dean, MD [facility]

## 2020-04-22 DIAGNOSIS — G4452 New daily persistent headache (NDPH): Secondary | ICD-10-CM

## 2020-04-22 HISTORY — DX: New daily persistent headache (ndph): G44.52

## 2020-05-06 ENCOUNTER — Ambulatory Visit: Payer: BC Managed Care – PPO | Admitting: Adult Health

## 2020-05-06 VITALS — BP 136/79 | HR 112 | Ht 65.5 in | Wt 174.0 lb

## 2020-05-06 DIAGNOSIS — F0281 Dementia in other diseases classified elsewhere with behavioral disturbance: Secondary | ICD-10-CM

## 2020-05-06 DIAGNOSIS — F02818 Dementia in other diseases classified elsewhere, unspecified severity, with other behavioral disturbance: Secondary | ICD-10-CM

## 2020-05-06 NOTE — Progress Notes (Addendum)
PATIENT: [place] A Klemens DOB: 26-Nov-1955  REASON FOR VISIT: follow up HISTORY FROM: patient  HISTORY OF PRESENT ILLNESS: Today 05/06/20:  Ms. Nova is a 64 year old female with a history of memory disturbance.  She returns today for follow-up.  She is here today with her daughter.  She feels that her memory has remained stable.  She lives at home with a sister.  She is able to complete all ADLs independently.  Her daughter helps her with her finances, medications and appointments.  Daughter reports that her mood is better.  She remains on Aricept 10 mg at bedtime and tolerates it well.  HISTORY 10/29/19:  Ms. Forcier is a 64 year old female with a history of memory disturbance.  She returns today for follow-up.  She is with her daughter.  She feels that her memory has remained the same.  She lives with her sister.  Able to complete all ADLs independently.  Her daughter manages her medications, appointments and finances.  Daughter feels that her mood is better.  Patient reports that she does some cooking.  She has tolerated Aricept 5 mg at bedtime.  REVIEW OF SYSTEMS: Out of a complete 14 system review of symptoms, the patient complains only of the following symptoms, and all other reviewed systems are negative.  See HPI  ALLERGIES: Allergies  Allergen Reactions  . Penicillins Hives    HOME MEDICATIONS: Outpatient Medications Prior to Visit  Medication Sig Dispense Refill  . albuterol (PROVENTIL HFA;VENTOLIN HFA) 108 (90 Base) MCG/ACT inhaler Inhale 2 puffs into the lungs every 4 (four) hours as needed for wheezing or shortness of breath. 1 Inhaler 2  . aspirin EC 81 MG tablet Take 81 mg by mouth daily. Swallow whole.    Marland Kitchen atorvastatin (LIPITOR) 10 MG tablet Take 1 tablet by mouth daily.  1  . donepezil (ARICEPT) 10 MG tablet Take 1 tablet (10 mg total) by mouth at bedtime. 30 tablet 5  . famotidine (PEPCID) 20 MG tablet One at bedtime 30 tablet 2  . lamoTRIgine (LAMICTAL) 100  MG tablet Take 150 mg by mouth daily.     . Melatonin 3 MG TABS Take 3 mg by mouth at bedtime.    . mirtazapine (REMERON) 7.5 MG tablet Take 7.5 mg by mouth at bedtime.    . montelukast (SINGULAIR) 10 MG tablet Take 10 mg by mouth daily.    . Multiple Vitamin (MULTIVITAMIN) capsule Take 1 capsule by mouth daily.    Marland Kitchen OLANZapine (ZYPREXA) 20 MG tablet Take 20 mg by mouth 2 (two) times daily.    . pantoprazole (PROTONIX) 40 MG tablet Take 1 tablet (40 mg total) by mouth daily. Take 30-60 min before first meal of the day 30 tablet 2  . aspirin 325 MG tablet Take 325 mg by mouth daily.    . cyanocobalamin 500 MCG tablet Take 500 mcg by mouth 2 (two) times daily. (Patient not taking: Reported on 05/06/2020)     No facility-administered medications prior to visit.    PAST MEDICAL HISTORY: Past Medical History:  Diagnosis Date  . Asthma   . Bipolar 1 disorder (HCC)   . Constipation   . COPD (chronic obstructive pulmonary disease) (HCC)   . Diabetes (HCC)   . DJD (degenerative joint disease)   . GERD (gastroesophageal reflux disease)   . Infection 2020   stomach infection  . Insomnia   . Overdose 08/2018   per daughter, hospitalized at [facility]. unsure if intentional.  . Transient cerebral  ischemia     PAST SURGICAL HISTORY: Past Surgical History:  Procedure Laterality Date  . ABDOMINAL HYSTERECTOMY  1999  . WISDOM TOOTH EXTRACTION      FAMILY HISTORY: Family History  Problem Relation Age of Onset  . Lymphoma Mother   . Hypertension Mother   . Stroke Mother   . Hypertension Sister   . Diabetes Sister   . Diabetes Daughter     SOCIAL HISTORY: Social History   Socioeconomic History  . Marital status: Divorced    Spouse name: Not on file  . Number of children: 1  . Years of education: 72  . Highest education level: Not on file  Occupational History    Comment: [employer]  Tobacco Use  . Smoking status: Former Smoker    Packs/day: 2.00    Years: 46.00    Pack years:  92.00    Types: Cigarettes    Quit date: 04/04/2016    Years since quitting: 4.0  . Smokeless tobacco: Never Used  Vaping Use  . Vaping Use: Never used  Substance and Sexual Activity  . Alcohol use: No  . Drug use: No    Comment: hx cocaine, marijuana   . Sexual activity: Not on file  Other Topics Concern  . Not on file  Social History Narrative   Lives alone. Sister lives with her and the patient takes care of her sister.    Drinks 2-4 liters Diet Coke daily   Right handed   Social Determinants of Health   Financial Resource Strain:   . Difficulty of Paying Living Expenses: Not on file  Food Insecurity:   . Worried About Programme researcher, broadcasting/film/video in the Last Year: Not on file  . Ran Out of Food in the Last Year: Not on file  Transportation Needs:   . Lack of Transportation (Medical): Not on file  . Lack of Transportation (Non-Medical): Not on file  Physical Activity:   . Days of Exercise per Week: Not on file  . Minutes of Exercise per Session: Not on file  Stress:   . Feeling of Stress : Not on file  Social Connections:   . Frequency of Communication with Friends and Family: Not on file  . Frequency of Social Gatherings with Friends and Family: Not on file  . Attends Religious Services: Not on file  . Active Member of Clubs or Organizations: Not on file  . Attends Banker Meetings: Not on file  . Marital Status: Not on file  Intimate Partner Violence:   . Fear of Current or Ex-Partner: Not on file  . Emotionally Abused: Not on file  . Physically Abused: Not on file  . Sexually Abused: Not on file      PHYSICAL EXAM  Vitals:   05/06/20 0907  BP: 136/79  Pulse: (!) 112  Weight: 174 lb (78.9 kg)  Height: 5' 5.5" (1.664 m)   Body mass index is 28.51 kg/m.   MMSE - Mini Mental State Exam 05/06/2020 10/29/2019 05/12/2017  Not completed: - (No Data) -  Orientation to time 4 5 3   Orientation to Place 4 3 3   Registration 3 3 3   Attention/ Calculation 5 5  5   Recall 3 2 1   Language- name 2 objects 2 2 2   Language- repeat 1 1 0  Language- follow 3 step command 3 3 3   Language- read & follow direction 1 1 1   Write a sentence 1 1 1   Copy design 1  1 0  Total score 28 27 22     Generalized: Well developed, in no acute distress   Neurological examination  Mentation: Alert oriented to time, place, history taking. Follows all commands speech and language fluent Cranial nerve II-XII: Pupils were equal round reactive to light. Extraocular movements were full, visual field were full on confrontational test. Head turning and shoulder shrug  were normal and symmetric. Motor: The motor testing reveals 5 over 5 strength of all 4 extremities. Good symmetric motor tone is noted throughout.  Sensory: Sensory testing is intact to soft touch on all 4 extremities. No evidence of extinction is noted.  Coordination: Cerebellar testing reveals good finger-nose-finger and heel-to-shin bilaterally.  Gait and station: Gait is normal.  Reflexes: Deep tendon reflexes are symmetric but hyporeflexic in the lower extremities  DIAGNOSTIC DATA (LABS, IMAGING, TESTING) - I reviewed patient records, labs, notes, testing and imaging myself where available.  Lab Results  Component Value Date   WBC 6.8 05/12/2017   HGB 12.7 05/12/2017   HCT 38.7 05/12/2017   MCV 93 05/12/2017   PLT 293 05/12/2017      Component Value Date/Time   NA 134 05/12/2017 1226   K 4.8 05/12/2017 1226   CL 98 05/12/2017 1226   CO2 22 05/12/2017 1226   GLUCOSE 86 05/12/2017 1226   BUN 7 05/12/2017 1226   CREATININE 0.87 05/12/2017 1226   CALCIUM 9.4 05/12/2017 1226   PROT 6.5 05/12/2017 1226   ALBUMIN 4.6 05/12/2017 1226   AST 14 05/12/2017 1226   ALT 8 05/12/2017 1226   ALKPHOS 113 05/12/2017 1226   BILITOT 0.2 05/12/2017 1226   GFRNONAA 73 05/12/2017 1226   GFRAA 84 05/12/2017 1226   No results found for: CHOL, HDL, LDLCALC, LDLDIRECT, TRIG, CHOLHDL No results found for:  HGBA1C Lab Results  Component Value Date   VITAMINB12 406 05/12/2017   Lab Results  Component Value Date   TSH 2.690 05/12/2017      ASSESSMENT AND PLAN 64 y.o. year old female  has a past medical history of Asthma, Bipolar 1 disorder (HCC), Constipation, COPD (chronic obstructive pulmonary disease) (HCC), Diabetes (HCC), DJD (degenerative joint disease), GERD (gastroesophageal reflux disease), Infection (2020), Insomnia, Overdose (08/2018), and Transient cerebral ischemia. here with:  1.  Dementia   Continue Aricept 10 mg at bedtime  Memory score stable MMSE 27/30  Did not qualify for research trial  Follow-up in 6 months or sooner if needed   I spent 20 minutes of face-to-face and non-face-to-face time with patient.  This included previsit chart review, lab review, study review, order entry, electronic health record documentation, patient education.  09/2018, MSN, NP-C 05/06/2020, 9:45 AM [facility] [address] [place], [place] [phone]  Made any corrections needed, and agree with history, physical, neuro exam,assessment and plan as stated.     [phone], MD [facility]

## 2020-05-06 NOTE — Patient Instructions (Signed)
Your Plan:  Continue Aricept  Memory score 27/30-- stable If your symptoms worsen or you develop new symptoms please let us know.   Thank you for coming to see [place] at [facility]. I hope we have been able to provide you high quality care today.  You may receive a patient satisfaction survey over the next few weeks. We would appreciate your feedback and comments so that we may continue to improve ourselves and the health of our patients.

## 2020-05-13 ENCOUNTER — Encounter: Payer: Self-pay | Admitting: Emergency Medicine

## 2020-05-14 ENCOUNTER — Encounter: Payer: Self-pay | Admitting: Neurology

## 2020-05-14 ENCOUNTER — Ambulatory Visit (INDEPENDENT_AMBULATORY_CARE_PROVIDER_SITE_OTHER): Payer: BC Managed Care – PPO | Admitting: Neurology

## 2020-05-14 ENCOUNTER — Telehealth: Payer: Self-pay | Admitting: Neurology

## 2020-05-14 VITALS — BP 139/86 | HR 88 | Ht 65.0 in | Wt 176.0 lb

## 2020-05-14 DIAGNOSIS — R519 Headache, unspecified: Secondary | ICD-10-CM

## 2020-05-14 DIAGNOSIS — R251 Tremor, unspecified: Secondary | ICD-10-CM | POA: Diagnosis not present

## 2020-05-14 DIAGNOSIS — G20A1 Parkinson's disease without dyskinesia, without mention of fluctuations: Secondary | ICD-10-CM

## 2020-05-14 DIAGNOSIS — R259 Unspecified abnormal involuntary movements: Secondary | ICD-10-CM

## 2020-05-14 DIAGNOSIS — G20 Parkinson's disease: Secondary | ICD-10-CM

## 2020-05-14 DIAGNOSIS — G4719 Other hypersomnia: Secondary | ICD-10-CM | POA: Diagnosis not present

## 2020-05-14 DIAGNOSIS — R51 Headache with orthostatic component, not elsewhere classified: Secondary | ICD-10-CM

## 2020-05-14 DIAGNOSIS — G20C Parkinsonism, unspecified: Secondary | ICD-10-CM

## 2020-05-14 MED ORDER — EMGALITY 120 MG/ML ~~LOC~~ SOAJ: 240.0000 mg | Freq: Once | SUBCUTANEOUS | 0 refills | Status: AC

## 2020-05-14 MED ORDER — EMGALITY 120 MG/ML ~~LOC~~ SOAJ: 120.0000 mg | SUBCUTANEOUS | 0 refills | Status: DC

## 2020-05-14 NOTE — Patient Instructions (Addendum)
Sleep evaluation: Will call MRI of the brain: Will call  Blood work: today Start Emgality:  DAT Scan  Brain DaTscan How to prepare and what to expect ?????????????????????????????????????What is a brain DaTscan? A brain DaTscan is a nuclear medicine scan. It uses radioactive material to diagnose some diseases of the brain, especially those that cause tremor (shakiness). DaTscan is a brand name for a drug called ioflupane I-123. A brain DaTscan is a form of radiology, because radiation is used to take pictures of the body. This radioactive drug is ordered especially for you. Because of this, we need at least 72 hours' notice if you must cancel or reschedule your scan.   How does the scan work? You will be given a small dose of tracer (radioactive material) through an intravenous (IV) line. This tracer will collect in part of your brain and give off gamma rays. A special camera called a gamma camera will use these rays to produce pictures and measurements of your brain. How do I prepare? Some drugs will affect the results of your brain DaTscan. You will need to stop taking these drugs before your scan. The table on page 2 lists the drugs that need to be stopped, and for how many days before your scan. This list is in alphabetical order by the generic name of the drug. The common brand names are listed beneath the generic name. Please confirm these instructions with your doctor who prescribed the drug. Drugs to Stop Taking Before your scan, stop taking these medicines for the length of time shown: Name of Drug Stop Taking  Amoxapine 4 days before  Benztropine  Cogentin 3 days before  Bupropion (Aplenzin, Budeprion, Voxra, Wellbutrin, Zyban) 48 hours before  Buspirone 15 hours before  Citalopram 24 hours before  Cocaine 6 hours before  Escitalopram 24 hours before  Methamphetamine 24 hours before  Methylphenidate (Concerta, Metadate, Methylin, Ritalin) 20 hours before  Paroxetine 24  hours before  Selegilene 48 hours before  Sertraline 3 days before  If you are breastfeeding, or if there is any chance you are pregnant, please tell the scheduler or technologist (the person who will help you prepare for your scan). How is the scan done? When you first arrive, we will ask you to drink a small cup of water with potassium iodine in it. This water may have a metallic taste.  An hour after you drink the potassium iodine water, the technologist will inject a small amount of tracer into a vein in your arm or hand through your IV.  You must stay in the department for 30 minutes after the injection.  You will then have a break for 3 hours. It is OK to eat and drink during this break.  You must return to the clinic after this 3-hour break to have images of your brain taken.  Then, 4 hours after you receive your tracer injection, the technologist will take images of your brain with the gamma camera. You will lie flat on the exam table while these images are being taken.  You must not move while the camera is taking pictures. If you move, the pictures will be blurry and may have to be taken again.  Taking the images will take 40 to 45 minutes. Your total time in the imaging room will be about 1 hour.  You may also have a low-dose CT scan of your brain to help confirm any results. A CT scan is another way to take images inside your body.  It will take about 5 hours from the time you drink the potassium iodine water until the scans are complete. What will I feel during the scan? The technologist will help make you as comfortable as possible on the exam table for the scan.  You may feel some minor discomfort from the IV.  Lying still on the exam table may be hard for some patients.  The camera will be close to your head. This may make you feel confined or uneasy (claustrophobic). Please tell the doctor who referred you for this scan if you know you are claustrophobic. Are there any side  effects from the scan? Most of the radioactivity from the tracer will pass out of your body in your urine or stool. The rest simply goes away over time.  Bad reactions to this scan are very rare. Fewer than 1% of patients (fewer than 1 out of 100) have a bad reaction. Reactions may include headache, nausea, vertigo (dizziness), or dry mouth. How do I get the results? When the test is over, the nuclear medicine doctor will review your images, prepare a written report, and talk with your doctor about the results. Your doctor will then talk with you about the results and your treatment options. If you needed to stop taking any medicines on the day of your scan, ask your doctor when to start taking them again.  The potentially interfering drugs consist of: amoxapine, amphetamine, benztropine, bupropion, buspirone, citalopram, cocaine, mazindol, methamphetamine, methylphenidate, norephedrine, phentermine, escitalopram, phenylpropanolamine, selegiline, paroxetine, and sertraline    Tremor A tremor is trembling or shaking that you cannot control. Most tremors affect the hands or arms. Tremors can also affect the head, vocal cords, face, and other parts of the body. There are many types of tremors. Common types include:  Essential tremor. These usually occur in people older than 40. It may run in families and can happen in otherwise healthy people.  Resting tremor. These occur when the muscles are at rest, such as when your hands are resting in your lap. People with Parkinson's disease often have resting tremors.  Postural tremor. These occur when you try to hold a pose, such as keeping your hands outstretched.  Kinetic tremor. These occur during purposeful movement, such as trying to touch a finger to your nose.  Task-specific tremor. These may occur when you perform certain tasks such as writing, speaking, or standing.  Psychogenic tremor. These dramatically lessen or disappear when you are  distracted. They can happen in people of all ages. Some types of tremors have no known cause. Tremors can also be a symptom of nervous system problems (neurological disorders) that may occur with aging. Some tremors go away with treatment, while others do not. Follow these instructions at home: Lifestyle      Limit alcohol intake to no more than 1 drink a day for nonpregnant women and 2 drinks a day for men. One drink equals 12 oz of beer, 5 oz of wine, or 1 oz of hard liquor.  Do not use any products that contain nicotine or tobacco, such as cigarettes and e-cigarettes. If you need help quitting, ask your health care provider.  Avoid extreme heat and extreme cold.  Limit your caffeine intake, as told by your health care provider.  Try to get 8 hours of sleep each night.  Find ways to manage your stress, such as meditation or yoga. General instructions  Take over-the-counter and prescription medicines only as told by your health care provider.  Keep all follow-up visits as told by your health care provider. This is important. Contact a health care provider if you:  Develop a tremor after starting a new medicine.  Have a tremor along with other symptoms such as: ? Numbness. ? Tingling. ? Pain. ? Weakness.  Notice that your tremor gets worse.  Notice that your tremor interferes with your day-to-day life. Summary  A tremor is trembling or shaking that you cannot control.  Most tremors affect the hands or arms.  Some types of tremors have no known cause. Others may be a symptom of nervous system problems (neurological disorders).  Make sure you discuss any tremors you have with your health care provider. This information is not intended to replace advice given to you by your health care provider. Make sure you discuss any questions you have with your health care provider. Document Revised: 07/21/2017 Document Reviewed: 06/08/2017 Elsevier Patient Education  2020 [employer].

## 2020-05-14 NOTE — Telephone Encounter (Signed)
Kimberly Ellison, I was able to locate and review all the [facility] images, notes, labs. I didn;t see an esr/crp ever ordered in all the visits. It is very unlikely she has temporal arteritis/giant cell arteritis based on her presentation and length of symptoms and recent slight improvement however we should really make sure. Ask them to come back next Monday and get these labs taken as well, sorry I didn't order them today. Again very unlikely but we would be remiss not to check this as well as everything else we discussed. Ask daughter to get on her mychart so I can connect with her via email. thanks

## 2020-05-14 NOTE — Progress Notes (Signed)
[facility]    Provider:  Dr Lucia Gaskins Requesting Provider: Gwenlyn Found, MD Primary Care Provider:  Gwenlyn Found, MD  CC:  New onset headaches  HPI:  Kimberly Ellison is a 64 y.o. female here as requested by Gwenlyn Found, MD for headache. 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  Also has a past medical history of cocaine addiction, depression, diabetes, anxiety, insomnia, marijuana dependence, suicidal ideation, TIA, COPD, hyperlipidemia, previous smoker.  She is here for new request intractable migraines.   I reviewed Dr. Unice Bailey notes: Patient has a remote history of migraines, but at the very end of June she started having them, slowly progressive now for persistent, worse in the morning, severe, associated photophobia and nausea, one other migraine in her life which was 15 years ago and similar to the one she is experiencing now, no known triggers, no focal neurologic deficits, does not recall if she had an aura, has been seen in the emergency room twice since onset and also at her primary care, she received migraine cocktails with some temporary improvement, she had a noncontrast head CT and a CT angio most recent visit in the emergency room in March 10, 2020 there were both unremarkable, she feels Phenergan has been somewhat helpful as long she takes 3 times a day, she quit smoking in 2017, there may be a compliance issue with patient, she is on multiple medications/polypharmacy.  Here with her daughter who provides much information. Symptoms/headaches started in June, no inciting events, no head trauma, it came on acutely, she did not go to work for 3 weeks, she went to the emergency, imaging was complete with CT and CTA was negative fir acute, she is taking zofran, twice a day. They are in the temples like someone is "popping her head", squeezing, pulsating/pounding/throbbing, she was having  light sensitivity, no sound sensitivity, + nausea/vomiting, she is still getting them, still worse in the morning, she snores very loud, fatigued, wakes often to urinate, doesn't feel rested in the morning.Zofran helps, daily headaches most days, severe, no vision changes, no triggers known. No illnesses, no new medications. Also new resting tremor. No changes in smell taste. No vivid dreams. No falls. Balance is fine. No other focal neurologic deficits, associated symptoms, inciting events or modifiable factors.  Reviewed notes, labs and imaging from outside physicians, which showed:  Most recent labs from primary care April 2021 included CBC with mild anemia, elevated MCV 110, elevated ALT 129, and elevated AST 55, also CMP with creatinine 0.79, BUN 11, potassium normal, slightly decreased sodium 131 and chloride 96.  Was able to find CTA of the head and CT head report in "Care Everywhere" reviewed reports below:  IMPRESSION: 02/2020 1. Negative for large vessel occlusion.  2. No aneurysm or dissection identified.   FINDINGS:   No evidence of hydrocephalus.  No intracranial mass, mass effect or midline shift.  No acute infarction evident.  No intracranial hemorrhage.  Paranasal sinuses: Clear.  Mastoid air cells: Clear.  Calvarium: Intact.    IMPRESSION:  No acute intracranial abnormality.   Review of Systems: Patient complains of symptoms per HPI as well as the following symptoms: headaches, tremor. Pertinent negatives and positives per HPI. All others negative.   Social History   Socioeconomic History  . Marital status: Divorced    Spouse name: Not on file  . Number of children: 1  . Years of education: 28  .  Highest education level: Not on file  Occupational History    Comment: [employer]  Tobacco Use  . Smoking status: Former Smoker    Packs/day: 2.00    Years: 46.00    Pack years: 92.00    Types: Cigarettes    Quit date: 04/04/2016    Years since quitting: 4.1    . Smokeless tobacco: Never Used  Vaping Use  . Vaping Use: Never used  Substance and Sexual Activity  . Alcohol use: No  . Drug use: No    Comment: hx cocaine, marijuana   . Sexual activity: Not on file  Other Topics Concern  . Not on file  Social History Narrative   Lives alone. Sister lives with her and the patient takes care of her sister.    Caffeine: stopped abruptly about 3 weeks ago    Right handed   Social Determinants of Health   Financial Resource Strain:   . Difficulty of Paying Living Expenses: Not on file  Food Insecurity:   . Worried About Programme researcher, broadcasting/film/videounning Out of Food in the Last Year: Not on file  . Ran Out of Food in the Last Year: Not on file  Transportation Needs:   . Lack of Transportation (Medical): Not on file  . Lack of Transportation (Non-Medical): Not on file  Physical Activity:   . Days of Exercise per Week: Not on file  . Minutes of Exercise per Session: Not on file  Stress:   . Feeling of Stress : Not on file  Social Connections:   . Frequency of Communication with Friends and Family: Not on file  . Frequency of Social Gatherings with Friends and Family: Not on file  . Attends Religious Services: Not on file  . Active Member of Clubs or Organizations: Not on file  . Attends BankerClub or Organization Meetings: Not on file  . Marital Status: Not on file  Intimate Partner Violence:   . Fear of Current or Ex-Partner: Not on file  . Emotionally Abused: Not on file  . Physically Abused: Not on file  . Sexually Abused: Not on file    Family History  Problem Relation Age of Onset  . Lymphoma Mother   . Hypertension Mother   . Stroke Mother   . Hypertension Sister   . Diabetes Sister   . Diabetes Daughter     Past Medical History:  Diagnosis Date  . Anemia   . Asthma   . Bipolar 1 disorder (HCC)   . Constipation   . COPD (chronic obstructive pulmonary disease) (HCC)   . Dementia (HCC)   . Diabetes (HCC)   . DJD (degenerative joint disease)   .  GERD (gastroesophageal reflux disease)   . Headache, new daily persistent ([facility]) 04/2020  . Infection 2020   stomach infection  . Insomnia   . Overdose 08/2018   per daughter, [facility]. unsure if intentional.  . Transient cerebral ischemia     Patient Active Problem List   Diagnosis Date Noted  . New onset of headaches after age 64 05/14/2020  . Dementia associated with other underlying disease with behavioral disturbance (HCC) 03/11/2019  . Lacunar infarction (HCC) 10/09/2017  . Obesity (BMI 30-39.9) 07/25/2017  . Cognitive complaints 05/14/2017  . Chronic asthma, mild persistent, uncomplicated 07/06/2016  . Upper airway cough syndrome 07/05/2016    Past Surgical History:  Procedure Laterality Date  . ABDOMINAL HYSTERECTOMY  1999  . WISDOM TOOTH EXTRACTION      Current Outpatient  Medications  Medication Sig Dispense Refill  . acetaminophen (TYLENOL) 500 MG tablet Take 500-1,500 mg by mouth 3 (three) times daily as needed.     Marland Kitchen albuterol (PROVENTIL HFA;VENTOLIN HFA) 108 (90 Base) MCG/ACT inhaler Inhale 2 puffs into the lungs every 4 (four) hours as needed for wheezing or shortness of breath. 1 Inhaler 2  . aspirin EC 81 MG tablet Take 81 mg by mouth daily. Swallow whole.    Marland Kitchen atorvastatin (LIPITOR) 10 MG tablet Take 1 tablet by mouth daily.  1  . calcium carbonate (OSCAL) 1500 (600 Ca) MG TABS tablet Take 1,500 mg by mouth daily.    . cyanocobalamin 500 MCG tablet Take 500 mcg by mouth 2 (two) times daily.     Marland Kitchen donepezil (ARICEPT) 10 MG tablet Take 1 tablet (10 mg total) by mouth at bedtime. 30 tablet 5  . famotidine (PEPCID) 20 MG tablet One at bedtime 30 tablet 2  . lamoTRIgine (LAMICTAL) 100 MG tablet Take 150 mg by mouth daily.     . Melatonin 3 MG TABS Take 3 mg by mouth at bedtime.    . meloxicam (MOBIC) 7.5 MG tablet Take 7.5 mg by mouth daily.    . methotrexate (RHEUMATREX) 2.5 MG tablet Take 2.5 mg by mouth once a week. Caution:Chemotherapy. Protect  from light.    . mirtazapine (REMERON) 7.5 MG tablet Take 7.5 mg by mouth at bedtime.    . montelukast (SINGULAIR) 10 MG tablet Take 10 mg by mouth daily.    . Multiple Vitamin (MULTIVITAMIN) capsule Take 1 capsule by mouth daily.    Marland Kitchen OLANZapine (ZYPREXA) 20 MG tablet Take 20 mg by mouth 2 (two) times daily.    . ondansetron (ZOFRAN-ODT) 4 MG disintegrating tablet Take 4 mg by mouth every 8 (eight) hours as needed.    . pantoprazole (PROTONIX) 40 MG tablet Take 1 tablet (40 mg total) by mouth daily. Take 30-60 min before first meal of the day 30 tablet 2  . promethazine (PHENERGAN) 25 MG tablet Take 12.5 mg by mouth every 8 (eight) hours as needed for nausea or vomiting.    . rizatriptan (MAXALT-MLT) 5 MG disintegrating tablet Take 5 mg by mouth as needed for migraine. May repeat in 2 hours if needed    . Galcanezumab-gnlm (EMGALITY) 120 MG/ML SOAJ Inject 240 mg into the skin once for 1 dose. 2 mL 0  . Galcanezumab-gnlm (EMGALITY) 120 MG/ML SOAJ Inject 120 mg into the skin every 30 (thirty) days. 2 mL 0   No current facility-administered medications for this visit.    Allergies as of 05/14/2020 - Review Complete 05/14/2020  Allergen Reaction Noted  . Penicillins Hives 07/05/2016    Vitals: BP 139/86 (BP Location: Right Arm, Patient Position: Sitting)   Pulse 88   Ht 5\' 5"  (1.651 m)   Wt 176 lb (79.8 kg)   BMI 29.29 kg/m  Last Weight:  Wt Readings from Last 1 Encounters:  05/14/20 176 lb (79.8 kg)   Last Height:   Ht Readings from Last 1 Encounters:  05/14/20 5\' 5"  (1.651 m)     Physical exam: Exam: Gen: NAD, conversant, well nourised, obese, well groomed                     CV: RRR, no MRG. No Carotid Bruits. No peripheral edema, warm, nontender Eyes: Conjunctivae clear without exudates or hemorrhage  Neuro: Detailed Neurologic Exam  Speech:    Speech is normal; fluent and spontaneous  with normal comprehension.  Cognition:    The patient is oriented to person, place,  and time;     recent and remote memory intact;     language fluent;     normal attention, concentration,     fund of knowledge Cranial Nerves:    The pupils are equal, round, and reactive to light. Could not visualize fundi due to small pupils. Visual fields are full to finger confrontation. Impaired upgaze . Trigeminal sensation is intact and the muscles of mastication are normal. The face is symmetric. The palate elevates in the midline. Hearing intact. Voice is normal. Shoulder shrug is normal. The tongue has normal motion without fasciculations.   Coordination:    Normal finger to nose  Gait:    Heel-toe and tandem gait are intact. Decreased arm swing with re-emergent right tremor  Motor Observation:    No asymmetry, no atrophy, resting course right > left Tone:    Normal muscle tone.    Posture:    Posture is normal. normal erect    Strength:    Strength is V/V in the upper and lower limbs.      Sensation: intact to LT     Reflex Exam:  DTR's:    Deep tendon reflexes in the upper and lower extremities are brisk bilaterally.   Toes:    The toes are equiv bilaterally.   Clonus:    Clonus is present at the Ajs 2 beats.    Assessment/Plan:   64 y.o. female here as requested by Gwenlyn Found, MD for headache. 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  Also has a past medical history of cocaine addiction, depression, diabetes, anxiety, insomnia, marijuana dependence, suicidal ideation, TIA, COPD, hyperlipidemia, previous smoker.  She is here for new request intractable migraines.  - worse in the morning, she snores very loud, fatigued, wakes often to urinate, doesn't feel rested in the morning.Supect OSA. Sleep evaluation/study - MRI of the brain needed due to positional and morning headaches, worsening, acute inset to evaluate for primary malignancies/space occupying masses, strokes, vasculitis, or  other - New onset course resting tremor, decreased arm swing with re-emergent tremor, concerning for parkinson's spectrum disorder, vs essential tremor or prior use of dopa-blocking drugs.DAT Scan - labs including TSH. No vision changes, ongoing for months, no jaw pain, no temple pain/tenderness, no vision changes, no shoulder.neck pain unlikely GCA -start emgality (gave her samples will last 3 months)  Orders Placed This Encounter  Procedures  . MR BRAIN W WO CONTRAST  . NM BRAIN DATSCAN TUMOR LOC INFLAM SPECT 1 DAY  . Comprehensive metabolic panel  . CBC with Differential/Platelets  . TSH  . Sedimentation rate  . C-reactive protein  . Sedimentation rate  . C-reactive protein  . Ambulatory referral to Sleep Studies   Meds ordered this encounter  Medications  . Galcanezumab-gnlm (EMGALITY) 120 MG/ML SOAJ    Sig: Inject 240 mg into the skin once for 1 dose.    Dispense:  2 mL    Refill:  0    Injected in clinic.    Order Specific Question:   Lot Number?    Answer:   [id number]    Order Specific Question:   Expiration Date?    Answer:   09/21/2021    Order Specific Question:   Quantity    Answer:   2  . Galcanezumab-gnlm (EMGALITY) 120 MG/ML SOAJ    Sig: Inject  120 mg into the skin every 30 (thirty) days.    Dispense:  2 mL    Refill:  0    Order Specific Question:   Lot Number?    Answer:   [id number]    Order Specific Question:   Expiration Date?    Answer:   09/21/2021    Order Specific Question:   Quantity    Answer:   2    Comments:   pen    Cc: Gwenlyn Found, MD,    Naomie Dean, MD  [facility] [address] [place], [place] [address]  Phone [phone] Fax [phone]  I spent over 90 minutes of face-to-face and non-face-to-face time with patient on the  1. New onset of headaches after age 23   2. Morning headache   3. Excessive daytime sleepiness   4. Tremor   5. Parkinson's disease (HCC)   6. Parkinsonism,  unspecified Parkinsonism type (HCC)   7. Mixed action and resting tremor   8. Positional headache    diagnosis.  This included previsit chart review, lab review, study review, order entry, electronic health record documentation, patient education on the different diagnostic and therapeutic options, counseling and coordination of care, risks and benefits of management, compliance, or risk factor reduction

## 2020-05-14 NOTE — Progress Notes (Signed)
Administered 240 mg Emgality in R thigh per vo Dr Lucia Gaskins. Pt and daughter educated on proper administration technique, side effects, and proper storage of medication prior to each injection. They understand the next injection will be 120 mg (1 pen) every 30 days starting 06/13/20.

## 2020-05-16 ENCOUNTER — Other Ambulatory Visit: Payer: Self-pay | Admitting: Neurology

## 2020-05-16 DIAGNOSIS — R718 Other abnormality of red blood cells: Secondary | ICD-10-CM

## 2020-05-16 NOTE — Telephone Encounter (Signed)
Kimberly Ellison, blood work is unremarkable except for MCV which is 109 so I would like to make sure she does not have B12 deficiency which can contribute to memory loss and can cause multiple problems. I wanted to get her back for an esr/crp anyway(see below) so I have ordered all these labs and would like them to come back when they can get back to have them taken thanks thanks

## 2020-05-17 ENCOUNTER — Other Ambulatory Visit: Payer: Self-pay | Admitting: Neurology

## 2020-05-17 DIAGNOSIS — E871 Hypo-osmolality and hyponatremia: Secondary | ICD-10-CM

## 2020-05-17 LAB — CBC WITH DIFFERENTIAL/PLATELET
Basophils Absolute: 0.1 10*3/uL (ref 0.0–0.2)
Basos: 1 %
EOS (ABSOLUTE): 0.1 10*3/uL (ref 0.0–0.4)
Eos: 1 %
Hematocrit: 38.7 % (ref 34.0–46.6)
Hemoglobin: 13.4 g/dL (ref 11.1–15.9)
Immature Grans (Abs): 0 10*3/uL (ref 0.0–0.1)
Immature Granulocytes: 0 %
Lymphocytes Absolute: 1.4 10*3/uL (ref 0.7–3.1)
Lymphs: 17 %
MCH: 37.6 pg — ABNORMAL HIGH (ref 26.6–33.0)
MCHC: 34.6 g/dL (ref 31.5–35.7)
MCV: 109 fL — ABNORMAL HIGH (ref 79–97)
Monocytes Absolute: 1 10*3/uL — ABNORMAL HIGH (ref 0.1–0.9)
Monocytes: 12 %
Neutrophils Absolute: 5.9 10*3/uL (ref 1.4–7.0)
Neutrophils: 69 %
Platelets: 305 10*3/uL (ref 150–450)
RBC: 3.56 x10E6/uL — ABNORMAL LOW (ref 3.77–5.28)
RDW: 12.7 % (ref 11.7–15.4)
WBC: 8.5 10*3/uL (ref 3.4–10.8)

## 2020-05-17 LAB — COMPREHENSIVE METABOLIC PANEL
ALT: 23 IU/L (ref 0–32)
AST: 21 IU/L (ref 0–40)
Albumin/Globulin Ratio: 2.2 (ref 1.2–2.2)
Albumin: 4.6 g/dL (ref 3.8–4.8)
Alkaline Phosphatase: 75 IU/L (ref 44–121)
BUN/Creatinine Ratio: 16 (ref 12–28)
BUN: 14 mg/dL (ref 8–27)
Bilirubin Total: 0.2 mg/dL (ref 0.0–1.2)
CO2: 19 mmol/L — ABNORMAL LOW (ref 20–29)
Calcium: 9.9 mg/dL (ref 8.7–10.3)
Chloride: 95 mmol/L — ABNORMAL LOW (ref 96–106)
Creatinine, Ser: 0.9 mg/dL (ref 0.57–1.00)
GFR calc Af Amer: 79 mL/min/{1.73_m2} (ref >59)
GFR calc non Af Amer: 69 mL/min/{1.73_m2} (ref >59)
Globulin, Total: 2.1 g/dL (ref 1.5–4.5)
Glucose: 94 mg/dL (ref 65–99)
Potassium: 4.6 mmol/L (ref 3.5–5.2)
Sodium: 130 mmol/L — ABNORMAL LOW (ref 134–144)
Total Protein: 6.7 g/dL (ref 6.0–8.5)

## 2020-05-17 LAB — TSH: TSH: 2.68 u[IU]/mL (ref 0.450–4.500)

## 2020-05-17 NOTE — Telephone Encounter (Signed)
Bethany: read prior notes but here a summary:  I was able to find all her [facility] notes and she never had an esr/crp I would like for her to have that completes. Bloodwork shows elevated MCV which is 109 so I would like to make sure she does not have B12 deficiency which can contribute to memory loss and can cause multiple problems. Also her sodium is on the low side so I would like to repeat a BMP;  I have ordered all these labs and would like them to come back when they can to have them taken. Since I have ordered these at different times let's make sure Alda Berthold knows bout all the labs so she doesn't miss any (esr,crp,bmp,b12 and folate, MMA) thanks thanks

## 2020-05-18 ENCOUNTER — Telehealth: Payer: Self-pay | Admitting: Neurology

## 2020-05-18 NOTE — Telephone Encounter (Signed)
Note placed on kisha's ([facility]) desk notifying of the labs.

## 2020-05-18 NOTE — Telephone Encounter (Signed)
[employer] Auth: NPR via [employer] wesbite order sent to GI. They will reach out to the patient to schedule.

## 2020-05-18 NOTE — Telephone Encounter (Signed)
I spoke with Cristie Hem (daughter on [place]) and discussed messages from Dr Lucia Gaskins regarding lab results so and more labs that have been ordered. She will bring the pt tomorrow AM around 9:00 for the lab work. Her questions were answered. She is aware we will call with those results when available.

## 2020-05-19 ENCOUNTER — Other Ambulatory Visit (INDEPENDENT_AMBULATORY_CARE_PROVIDER_SITE_OTHER): Payer: Self-pay

## 2020-05-19 DIAGNOSIS — R51 Headache with orthostatic component, not elsewhere classified: Secondary | ICD-10-CM

## 2020-05-19 DIAGNOSIS — R259 Unspecified abnormal involuntary movements: Secondary | ICD-10-CM

## 2020-05-19 DIAGNOSIS — G20 Parkinson's disease: Secondary | ICD-10-CM

## 2020-05-19 DIAGNOSIS — R251 Tremor, unspecified: Secondary | ICD-10-CM

## 2020-05-19 DIAGNOSIS — Z0289 Encounter for other administrative examinations: Secondary | ICD-10-CM

## 2020-05-19 DIAGNOSIS — R519 Headache, unspecified: Secondary | ICD-10-CM

## 2020-05-19 DIAGNOSIS — G4719 Other hypersomnia: Secondary | ICD-10-CM

## 2020-05-19 DIAGNOSIS — R718 Other abnormality of red blood cells: Secondary | ICD-10-CM

## 2020-05-19 DIAGNOSIS — E871 Hypo-osmolality and hyponatremia: Secondary | ICD-10-CM

## 2020-05-19 NOTE — Addendum Note (Signed)
Addended by: Bertram Savin on: 05/19/2020 09:48 AM   Modules accepted: Orders

## 2020-05-25 ENCOUNTER — Other Ambulatory Visit: Payer: Self-pay | Admitting: Neurology

## 2020-05-25 ENCOUNTER — Telehealth: Payer: Self-pay | Admitting: *Deleted

## 2020-05-25 MED ORDER — NURTEC 75 MG PO TBDP: 75.0000 mg | ORAL_TABLET | ORAL | 6 refills | Status: DC

## 2020-05-25 NOTE — Telephone Encounter (Signed)
-----   Message from Anson Fret, MD sent at 05/21/2020  6:07 PM EDT ----- Labs unremarkable thanks

## 2020-05-25 NOTE — Telephone Encounter (Signed)
I called Kimberly Ellison (on DPR) and LVM (ok per DPR) advising the lab results are unremarkable, waiting on MMA, will call only if abnormal. I left the office number in message for a call back if she has any questions.

## 2020-05-26 LAB — BASIC METABOLIC PANEL
BUN/Creatinine Ratio: 16 (ref 12–28)
BUN: 12 mg/dL (ref 8–27)
CO2: 21 mmol/L (ref 20–29)
Calcium: 11.6 mg/dL — ABNORMAL HIGH (ref 8.7–10.3)
Chloride: 96 mmol/L (ref 96–106)
Creatinine, Ser: 0.77 mg/dL (ref 0.57–1.00)
GFR calc Af Amer: 96 mL/min/{1.73_m2} (ref >59)
GFR calc non Af Amer: 83 mL/min/{1.73_m2} (ref >59)
Glucose: 139 mg/dL — ABNORMAL HIGH (ref 65–99)
Potassium: 4.8 mmol/L (ref 3.5–5.2)
Sodium: 134 mmol/L (ref 134–144)

## 2020-05-26 LAB — SEDIMENTATION RATE: Sed Rate: 14 mm/hr (ref 0–40)

## 2020-05-26 LAB — METHYLMALONIC ACID, SERUM: Methylmalonic Acid: 159 nmol/L (ref 0–378)

## 2020-05-26 LAB — B12 AND FOLATE PANEL
Folate: >20 ng/mL (ref >3.0)
Vitamin B-12: 1037 pg/mL (ref 232–1245)

## 2020-05-26 LAB — C-REACTIVE PROTEIN: CRP: 2 mg/L (ref 0–10)

## 2020-05-28 ENCOUNTER — Telehealth: Payer: Self-pay | Admitting: *Deleted

## 2020-05-28 NOTE — Telephone Encounter (Signed)
Completed Nurtec PA on Cover My Meds. Key: [id number]. Awaiting determination from [employer].

## 2020-06-02 NOTE — Telephone Encounter (Signed)
Received a request from [facility] [place] for clinical notes. I faxed the office note to [facility]. Received a receipt of confirmation.

## 2020-06-05 ENCOUNTER — Ambulatory Visit
Admission: RE | Admit: 2020-06-05 | Discharge: 2020-06-05 | Disposition: A | Discharge status: Home / Self Care | Payer: BC Managed Care – PPO | Source: Ambulatory Visit | Attending: Neurology | Admitting: Neurology

## 2020-06-05 DIAGNOSIS — G20C Parkinsonism, unspecified: Secondary | ICD-10-CM

## 2020-06-05 DIAGNOSIS — G4719 Other hypersomnia: Secondary | ICD-10-CM

## 2020-06-05 DIAGNOSIS — R251 Tremor, unspecified: Secondary | ICD-10-CM

## 2020-06-05 DIAGNOSIS — R519 Headache, unspecified: Secondary | ICD-10-CM

## 2020-06-05 DIAGNOSIS — G20 Parkinson's disease: Secondary | ICD-10-CM

## 2020-06-05 DIAGNOSIS — R51 Headache with orthostatic component, not elsewhere classified: Secondary | ICD-10-CM

## 2020-06-05 DIAGNOSIS — R259 Unspecified abnormal involuntary movements: Secondary | ICD-10-CM

## 2020-06-05 MED ORDER — GADOBENATE DIMEGLUMINE 529 MG/ML IV SOLN
16.0000 mL | Freq: Once | INTRAVENOUS | Status: AC | PRN
  Administered 2020-06-05: 16 mL via INTRAVENOUS

## 2020-06-08 ENCOUNTER — Telehealth: Payer: Self-pay | Admitting: *Deleted

## 2020-06-08 NOTE — Telephone Encounter (Signed)
Per CMM, this has been Denied. No reasons given, but they should be faxing determination.

## 2020-06-08 NOTE — Telephone Encounter (Signed)
I called the pt's daughter Cristie Hem (on [place]) and discussed per Dr. Lucia Gaskins, pt's MRI brain showed no changes from 2018. I also let her know we just received word from insurance that Nurtec has been denied. I let her know the pt should be able to use a savings card downloadable from [email or web address]. I let her know we would be in touch after closer look at the denial. She verbalized appreciation.

## 2020-06-08 NOTE — Telephone Encounter (Signed)
-----   Message from Anson Fret, MD sent at 06/06/2020 11:12 AM EDT ----- No changes from MRI in 2018. thanks

## 2020-06-10 ENCOUNTER — Encounter (HOSPITAL_COMMUNITY)
Admission: RE | Admit: 2020-06-10 | Discharge: 2020-06-10 | Disposition: A | Discharge status: Home / Self Care | Payer: BC Managed Care – PPO | Source: Ambulatory Visit | Attending: Neurology | Admitting: Neurology

## 2020-06-10 ENCOUNTER — Other Ambulatory Visit: Payer: Self-pay

## 2020-06-10 DIAGNOSIS — R519 Headache, unspecified: Secondary | ICD-10-CM

## 2020-06-10 DIAGNOSIS — R259 Unspecified abnormal involuntary movements: Secondary | ICD-10-CM | POA: Diagnosis present

## 2020-06-10 DIAGNOSIS — G4719 Other hypersomnia: Secondary | ICD-10-CM | POA: Diagnosis present

## 2020-06-10 DIAGNOSIS — R51 Headache with orthostatic component, not elsewhere classified: Secondary | ICD-10-CM

## 2020-06-10 DIAGNOSIS — G20 Parkinson's disease: Secondary | ICD-10-CM | POA: Diagnosis present

## 2020-06-10 DIAGNOSIS — R251 Tremor, unspecified: Secondary | ICD-10-CM | POA: Diagnosis present

## 2020-06-10 MED ORDER — POTASSIUM IODIDE (ANTIDOTE) 130 MG PO TABS
ORAL_TABLET | ORAL | Status: AC
  Filled 2020-06-10: qty 1

## 2020-06-10 MED ORDER — IOFLUPANE I 123 185 MBQ/2.5ML IV SOLN
4.8000 | Freq: Once | INTRAVENOUS | Status: AC | PRN
  Administered 2020-06-10: 4.8 via INTRAVENOUS
  Filled 2020-06-10: qty 5

## 2020-06-10 MED ORDER — POTASSIUM IODIDE (ANTIDOTE) 130 MG PO TABS
130.0000 mg | ORAL_TABLET | Freq: Once | ORAL | Status: AC
  Administered 2020-06-10: 130 mg via ORAL

## 2020-06-11 ENCOUNTER — Telehealth: Payer: Self-pay | Admitting: *Deleted

## 2020-06-11 NOTE — Telephone Encounter (Signed)
Spoke with pt's daughter Cristie Hem and reviewed results of DAT scan with the patient. She verbalized understanding and appreciation. Desiree advised the shaking episodes were happening while patient had c-diff and was recovering from a UTI and was told she needed to take sodium and potassium. She asked for Dr Lucia Gaskins to advise if the patient should be taking sodium and potassium supplements. She is eating "like a kid". I also let her know that 16 tablets of Nurtec went through copay card at pharmacy and they will fill this for patient. We discussed the Nurtec instructions and also discussed that if patient is unable to get this next month we may need to try mail order pharmacy. I confirmed Desiree's address and mailed the copay card to her for future use. She was very Adult nurse.

## 2020-06-11 NOTE — Telephone Encounter (Signed)
I would defer to primary care, she probably needs blood work again to recheck. thanks

## 2020-06-11 NOTE — Telephone Encounter (Signed)
-----   Message from Anson Fret, MD sent at 06/10/2020  5:12 PM EDT ----- DAT scan is negative for parkinson's disease. Tremor may be due to medication or other causes but not parkinson's disease spectrum. Great news. thanks

## 2020-06-12 NOTE — Telephone Encounter (Signed)
Spoke with pt's daughter Cristie Hem (on [place]) and advised that Dr Lucia Gaskins will defer management of electrolytes to PCP as we discussed yesterday. I let her know the Sodium and Potassium were not out of range when the labs were last completed by Dr Lucia Gaskins however Sodium was low normal. Suggested they contact PCP. She verbalized understanding and appreciation.

## 2020-06-22 ENCOUNTER — Encounter: Payer: Self-pay | Admitting: Neurology

## 2020-06-22 ENCOUNTER — Other Ambulatory Visit: Payer: Self-pay

## 2020-06-22 ENCOUNTER — Ambulatory Visit: Payer: BC Managed Care – PPO | Admitting: Neurology

## 2020-06-22 VITALS — BP 128/84 | HR 88 | Ht 65.0 in | Wt 176.0 lb

## 2020-06-22 DIAGNOSIS — G4719 Other hypersomnia: Secondary | ICD-10-CM | POA: Diagnosis not present

## 2020-06-22 DIAGNOSIS — R519 Headache, unspecified: Secondary | ICD-10-CM

## 2020-06-22 DIAGNOSIS — G4711 Idiopathic hypersomnia with long sleep time: Secondary | ICD-10-CM | POA: Diagnosis not present

## 2020-06-22 DIAGNOSIS — R351 Nocturia: Secondary | ICD-10-CM

## 2020-06-22 DIAGNOSIS — F19982 Other psychoactive substance use, unspecified with psychoactive substance-induced sleep disorder: Secondary | ICD-10-CM

## 2020-06-22 NOTE — Patient Instructions (Signed)
Quality Sleep Information, Adult Quality sleep is important for your mental and physical health. It also improves your quality of life. Quality sleep means you:  Are asleep for most of the time you are in bed.  Fall asleep within 30 minutes.  Wake up no more than once a night.  Are awake for no longer than 20 minutes if you do wake up during the night. Most adults need 7-8 hours of quality sleep each night. How can poor sleep affect me? If you do not get enough quality sleep, you may have:  Mood swings.  Daytime sleepiness.  Confusion.  Decreased reaction time.  Sleep disorders, such as insomnia and sleep apnea.  Difficulty with: ? Solving problems. ? Coping with stress. ? Paying attention. These issues may affect your performance and productivity at work, school, and at home. Lack of sleep may also put you at higher risk for accidents, suicide, and risky behaviors. If you do not get quality sleep you may also be at higher risk for several health problems, including:  Infections.  Type 2 diabetes.  Heart disease.  High blood pressure.  Obesity.  Worsening of long-term conditions, like arthritis, kidney disease, depression, Parkinson's disease, and epilepsy. What actions can I take to get more quality sleep?      Stick to a sleep schedule. Go to sleep and wake up at about the same time each day. Do not try to sleep less on weekdays and make up for lost sleep on weekends. This does not work.  Try to get about 30 minutes of exercise on most days. Do not exercise 2-3 hours before going to bed.  Limit naps during the day to 30 minutes or less.  Do not use any products that contain nicotine or tobacco, such as cigarettes or e-cigarettes. If you need help quitting, ask your health care provider.  Do not drink caffeinated beverages for at least 8 hours before going to bed. Coffee, tea, and some sodas contain caffeine.  Do not drink alcohol close to bedtime.  Do not  eat large meals close to bedtime.  Do not take naps in the late afternoon.  Try to get at least 30 minutes of sunlight every day. Morning sunlight is best.  Make time to relax before bed. Reading, listening to music, or taking a hot bath promotes quality sleep.  Make your bedroom a place that promotes quality sleep. Keep your bedroom dark, quiet, and at a comfortable room temperature. Make sure your bed is comfortable. Take out sleep distractions like TV, a computer, smartphone, and bright lights.  If you are lying awake in bed for longer than 20 minutes, get up and do a relaxing activity until you feel sleepy.  Work with your health care provider to treat medical conditions that may affect sleeping, such as: ? Nasal obstruction. ? Snoring. ? Sleep apnea and other sleep disorders.  Talk to your health care provider if you think any of your prescription medicines may cause you to have difficulty falling or staying asleep.  If you have sleep problems, talk with a sleep consultant. If you think you have a sleep disorder, talk with your health care provider about getting evaluated by a specialist. Where to find more information  National Sleep Foundation website: https://sleepfoundation.org  National Heart, Lung, and Blood Institute (NHLBI): www.nhlbi.nih.gov/files/docs/public/sleep/healthy_sleep.pdf  Centers for Disease Control and Prevention (CDC): www.cdc.gov/sleep/index.html Contact a health care provider if you:  Have trouble getting to sleep or staying asleep.  Often wake up   very early in the morning and cannot get back to sleep.  Have daytime sleepiness.  Have daytime sleep attacks of suddenly falling asleep and sudden muscle weakness (narcolepsy).  Have a tingling sensation in your legs with a strong urge to move your legs (restless legs syndrome).  Stop breathing briefly during sleep (sleep apnea).  Think you have a sleep disorder or are taking a medicine that is  affecting your quality of sleep. Summary  Most adults need 7-8 hours of quality sleep each night.  Getting enough quality sleep is an important part of health and well-being.  Make your bedroom a place that promotes quality sleep and avoid things that may cause you to have poor sleep, such as alcohol, caffeine, smoking, and large meals.  Talk to your health care provider if you have trouble falling asleep or staying asleep. This information is not intended to replace advice given to you by your health care provider. Make sure you discuss any questions you have with your health care provider. Document Revised: 11/15/2017 Document Reviewed: 11/15/2017 Elsevier Patient Education  2020 Elsevier Inc. Hypersomnia Hypersomnia is a condition in which a person feels very tired during the day even though he or she gets plenty of sleep at night. A person with this condition may take naps during the day and may find it very difficult to wake up from sleep. Hypersomnia may affect a person's ability to think, concentrate, drive, or remember things. What are the causes? The cause of this condition may not be known. Possible causes include:  Certain medicines.  Sleep disorders, such as narcolepsy and sleep apnea.  Injury to the head, brain, or spinal cord.  Drug or alcohol use.  Gastroesophageal reflux disease (GERD).  Tumors.  Certain medical conditions, such as depression, diabetes, or an underactive thyroid gland (hypothyroidism). What are the signs or symptoms? The main symptoms of hypersomnia include:  Feeling very tired throughout the day, regardless of how much sleep you got the night before.  Having trouble waking up. Others may find it difficult to wake you up when you are sleeping.  Sleeping for longer and longer periods at a time.  Taking naps throughout the day. Other symptoms may include:  Feeling restless, anxious, or annoyed.  Lacking energy.  Having trouble  with: ? Remembering. ? Speaking. ? Thinking.  Loss of appetite.  Seeing, hearing, tasting, smelling, or feeling things that are not real (hallucinations). How is this diagnosed? This condition may be diagnosed based on:  Your symptoms and medical history.  Your sleeping habits. Your health care provider may ask you to write down your sleeping habits in a daily sleep log, along with any symptoms you have.  A series of tests that are done while you sleep (sleep study or polysomnogram).  A test that measures how quickly you can fall asleep during the day (daytime nap study or multiple sleep latency test). How is this treated? Treatment can help you manage your condition. Treatment may include:  Following a regular sleep routine.  Lifestyle changes, such as changing your eating habits, getting regular exercise, and avoiding alcohol or caffeinated beverages.  Taking medicines to make you more alert (stimulants) during the day.  Treating any underlying medical causes of hypersomnia. Follow these instructions at home: Sleep routine   Schedule the same bedtime and wake-up time each day.  Practice a relaxing bedtime routine. This may include reading, meditation, deep breathing, or taking a warm bath before going to sleep.  Get regular exercise   each day. Avoid strenuous exercise in the evening hours.  Keep your sleep environment at a cooler temperature, darkened, and quiet.  Sleep with pillows and a mattress that are comfortable and supportive.  Schedule short 20-minute naps for when you feel sleepiest during the day.  Talk with your employer or teachers about your hypersomnia. If possible, adjust your schedule so that: ? You have a regular daytime work schedule. ? You can take a scheduled nap during the day. ? You do not have to work or be active at night.  Do not eat a heavy meal for a few hours before bedtime. Eat your meals at about the same times every day.  Avoid  drinking alcohol or caffeinated beverages. Safety   Do not drive or use heavy machinery if you are sleepy. Ask your health care provider if it is safe for you to drive.  Wear a life jacket when swimming or spending time near water. General instructions  Take supplements and over-the-counter and prescription medicines only as told by your health care provider.  Keep a sleep log that will help your doctor manage your condition. This may include information about: ? What time you go to bed each night. ? How often you wake up at night. ? How many hours you sleep at night. ? How often and for how long you nap during the day. ? Any observations from others, such as leg movements during sleep, sleep walking, or snoring.  Keep all follow-up visits as told by your health care provider. This is important. Contact a health care provider if:  You have new symptoms.  Your symptoms get worse. Get help right away if:  You have serious thoughts about hurting yourself or someone else. If you ever feel like you may hurt yourself or others, or have thoughts about taking your own life, get help right away. You can go to your nearest emergency department or call:  Your local emergency services (911 in the [place]  A suicide crisis helpline, such as the National Suicide Prevention Lifeline at 1-800-273-8255. This is open 24 hours a day. Summary  Hypersomnia refers to a condition in which you feel very tired during the day even though you get plenty of sleep at night.  A person with this condition may take naps during the day and may find it very difficult to wake up from sleep.  Hypersomnia may affect a person's ability to think, concentrate, drive, or remember things.  Treatment, such as following a regular sleep routine and making some lifestyle changes, can help you manage your condition. This information is not intended to replace advice given to you by your health care provider. Make sure you  discuss any questions you have with your health care provider. Document Revised: 08/10/2017 Document Reviewed: 08/10/2017 Elsevier Patient Education  2020 Elsevier Inc.  

## 2020-06-22 NOTE — Progress Notes (Signed)
SLEEP MEDICINE CLINIC    Provider:  Melvyn Novas, MD  Primary Care Physician:  Gwenlyn Found, MD [address] [place] [address]     Referring Provider: Ahern,MD         Chief Complaint according to patient   Patient presents with:    . New Patient (Initial Visit)     pt with daughter, rm 89. presents today with with complaints of waking up daily with headache. she has in past during her sleep woke up sleep walking, misplacing things and cutting up pillow cases with scissors, while sleeping. she never felt like she slept.      HISTORY OF PRESENT ILLNESS:  Kimberly Ellison is a 64 year- old  Caucasian female patient and seen here upon a referral on 06/22/2020 from Dr Lucia Gaskins.  Chief concern according to dr Lucia Gaskins: : Here with her daughter who provides much information. Symptoms/headaches started in June, no inciting events, no head trauma, it came on acutely, she did not go to work for 3 weeks, she went to the emergency, imaging was complete with CT and CTA was negative fir acute, she is taking zofran, twice a day. They are in the temples like someone is "popping her head", squeezing, pulsating/pounding/throbbing, she was having light sensitivity, no sound sensitivity, + nausea/vomiting, she is still getting them, still worse in the morning, she snores very loud, fatigued, wakes often to urinate, doesn't feel rested in the morning.Zofran helps, daily headaches most days, severe, no vision changes, no triggers known. No illnesses, no new medications. Also new resting tremor. No changes in smell taste. No vivid dreams. No falls. Balance is fine. No other focal neurologic deficits, associated symptoms, inciting events or modifiable factors.    I have the pleasure of seeing Kimberly A Desrochers today, a right-handed White or Caucasian female with a possible sleep disorder. who  has a past medical history of Anemia, Asthma, Bipolar 1 disorder (HCC), Constipation, COPD (chronic  obstructive pulmonary disease) (HCC), Dementia (HCC), Diabetes (HCC), DJD (degenerative joint disease), GERD (gastroesophageal reflux disease), Headache, new daily persistent (ND[facility]) (04/2020), Infection (2020), Insomnia, Overdose (08/2018), and Transient cerebral ischemia.     Sleep relevant medical history: Nocturia 5 times (!), Sleep walking- sleep activity,  Parasomnia - Tonsillectomy,substance abuse, chronic Insomnia. Sleep walking in childhood.   Family medical /sleep history: No other family member on CPAP with OSA, many family members snore ( sisters).    Social history:  Patient is working as Environmental health practitioner.  and lives in a household with sister, whose caretaker she is- . Family status is divorced with one adult daughter.  The patient currently works in daytime..Pets are present, a bird and a dog.  Tobacco use: quit 2017.   ETOH use; 2001,  Caffeine intake in form of Coffee( 3 cups) Soda( 4-5 a day) Tea ( /) or energy drinks. Regular exercise - none .   Hobbies :none .       Sleep habits are as follows:  The patient's dinner time is between 4-5 PM. The patient goes to bed at 6.30 PM and continues to sleep for 10-11 hours, wakes for many bathroom breaks, the first time at 11 PM.   The bedroom is dark, cool, quiet.  The preferred sleep position is left sided, with the support of 2 pillows.  Dreams are reportedly rare.  5 AM is the usual rise time. The patient wakes up spontaneously.  She reports not feeling refreshed or  restored in AM, with symptoms such as dry mouth, morning headaches - different from her migraines- , and residual fatigue.  Naps are taken very infrequently,but may doze off-  lasting from 15-30 minutes and in front of the TV-  Review of Systems: Out of a complete 14 system review, the patient complains of only the following symptoms, and all other reviewed systems are negative.:  Fatigue, sleepiness , snoring, fragmented sleep, Insomnia - daughter states  her mother can fall asleep in noisy and busy situations, considers her extremely sleepy.    How likely are you to doze in the following situations: 0 = not likely, 1 = slight chance, 2 = moderate chance, 3 = high chance   Sitting and Reading? Watching Television? Sitting inactive in a public place (theater or meeting)? As a passenger in a car for an hour without a break? Lying down in the afternoon when circumstances permit? Sitting and talking to someone? Sitting quietly after lunch without alcohol? In a car, while stopped for a few minutes in traffic?   Total = 15/ 24 points  With hypersomnia with long sleep time.   FSS endorsed at  37/ 63 points.   Social History   Socioeconomic History  . Marital status: Divorced    Spouse name: Not on file  . Number of children: 1  . Years of education: 26  . Highest education level: Not on file  Occupational History    Comment: [employer]  Tobacco Use  . Smoking status: Former Smoker    Packs/day: 2.00    Years: 46.00    Pack years: 92.00    Types: Cigarettes    Quit date: 04/04/2016    Years since quitting: 4.2  . Smokeless tobacco: Never Used  Vaping Use  . Vaping Use: Never used  Substance and Sexual Activity  . Alcohol use: No  . Drug use: No    Comment: hx cocaine, marijuana   . Sexual activity: Not on file  Other Topics Concern  . Not on file  Social History Narrative   Lives alone. Sister lives with her and the patient takes care of her sister.    Caffeine: stopped abruptly about 3 weeks ago    Right handed   Social Determinants of Health   Financial Resource Strain:   . Difficulty of Paying Living Expenses: Not on file  Food Insecurity:   . Worried About Programme researcher, broadcasting/film/video in the Last Year: Not on file  . Ran Out of Food in the Last Year: Not on file  Transportation Needs:   . Lack of Transportation (Medical): Not on file  . Lack of Transportation (Non-Medical): Not on file  Physical Activity:   . Days of  Exercise per Week: Not on file  . Minutes of Exercise per Session: Not on file  Stress:   . Feeling of Stress : Not on file  Social Connections:   . Frequency of Communication with Friends and Family: Not on file  . Frequency of Social Gatherings with Friends and Family: Not on file  . Attends Religious Services: Not on file  . Active Member of Clubs or Organizations: Not on file  . Attends Banker Meetings: Not on file  . Marital Status: Not on file    Family History  Problem Relation Age of Onset  . Lymphoma Mother   . Hypertension Mother   . Stroke Mother   . Hypertension Sister   . Diabetes Sister   .  Diabetes Daughter     Past Medical History:  Diagnosis Date  . Anemia   . Asthma   . Bipolar 1 disorder (HCC)   . Constipation   . COPD (chronic obstructive pulmonary disease) (HCC)   . Dementia (HCC)   . Diabetes (HCC)   . DJD (degenerative joint disease)   . GERD (gastroesophageal reflux disease)   . Headache, new daily persistent (ND[facility]) 04/2020  . Infection 2020   stomach infection  . Insomnia   . Overdose 08/2018   per daughter, hospitalized at [facility]. unsure if intentional.  . Transient cerebral ischemia     Past Surgical History:  Procedure Laterality Date  . ABDOMINAL HYSTERECTOMY  1999  . WISDOM TOOTH EXTRACTION       Current Outpatient Medications on File Prior to Visit  Medication Sig Dispense Refill  . acetaminophen (TYLENOL) 500 MG tablet Take 500-1,500 mg by mouth 3 (three) times daily as needed.     Marland Kitchen albuterol (PROVENTIL HFA;VENTOLIN HFA) 108 (90 Base) MCG/ACT inhaler Inhale 2 puffs into the lungs every 4 (four) hours as needed for wheezing or shortness of breath. 1 Inhaler 2  . aspirin EC 81 MG tablet Take 81 mg by mouth daily. Swallow whole.    Marland Kitchen atorvastatin (LIPITOR) 10 MG tablet Take 1 tablet by mouth daily.  1  . calcium carbonate (OSCAL) 1500 (600 Ca) MG TABS tablet Take 1,500 mg by mouth daily.    . cyanocobalamin 500 MCG  tablet Take 500 mcg by mouth 2 (two) times daily.     Marland Kitchen donepezil (ARICEPT) 10 MG tablet Take 1 tablet (10 mg total) by mouth at bedtime. 30 tablet 5  . famotidine (PEPCID) 20 MG tablet One at bedtime 30 tablet 2  . Galcanezumab-gnlm (EMGALITY) 120 MG/ML SOAJ Inject 120 mg into the skin every 30 (thirty) days. 2 mL 0  . lamoTRIgine (LAMICTAL) 100 MG tablet Take 150 mg by mouth daily.     . Melatonin 3 MG TABS Take 3 mg by mouth at bedtime.    . meloxicam (MOBIC) 7.5 MG tablet Take 7.5 mg by mouth daily.    . methotrexate (RHEUMATREX) 2.5 MG tablet Take 2.5 mg by mouth once a week. Caution:Chemotherapy. Protect from light.    . mirtazapine (REMERON) 7.5 MG tablet Take 7.5 mg by mouth at bedtime.    . montelukast (SINGULAIR) 10 MG tablet Take 10 mg by mouth daily.    . Multiple Vitamin (MULTIVITAMIN) capsule Take 1 capsule by mouth daily.    Marland Kitchen OLANZapine (ZYPREXA) 20 MG tablet Take 20 mg by mouth 2 (two) times daily.    . ondansetron (ZOFRAN-ODT) 4 MG disintegrating tablet Take 4 mg by mouth every 8 (eight) hours as needed.    . pantoprazole (PROTONIX) 40 MG tablet Take 1 tablet (40 mg total) by mouth daily. Take 30-60 min before first meal of the day 30 tablet 2  . promethazine (PHENERGAN) 25 MG tablet Take 12.5 mg by mouth every 8 (eight) hours as needed for nausea or vomiting.    . Rimegepant Sulfate (NURTEC) 75 MG TBDP Take 75 mg by mouth every other day. For migraines. 16 tablet 6   No current facility-administered medications on file prior to visit.    Allergies  Allergen Reactions  . Penicillins Hives    Physical exam:  Today's Vitals   06/22/20 0906  BP: 128/84  Pulse: 88  Weight: 176 lb (79.8 kg)  Height:  (1.651 m)   Body mass  index is 29.29 kg/m.   Wt Readings from Last 3 Encounters:  06/22/20 176 lb (79.8 kg)  05/14/20 176 lb (79.8 kg)  05/06/20 174 lb (78.9 kg)     Ht Readings from Last 3 Encounters:  06/22/20 5\' 5"  (1.651 m)  05/14/20 5\' 5"  (1.651 m)    05/06/20 5' 5.5" (1.664 m)      General: The patient is awake, alert and appears not in acute distress. The patient is well groomed. Head: Normocephalic, atraumatic. Neck is supple. Mallampati 2- stubby short uvula, lateral crowding , very red.,  neck circumference:16 inches . Nasal airflow  patent.  Retrognathia is noted seen.  Dental status: full dentures.  Cardiovascular:  Regular rate and cardiac rhythm by pulse,  without distended neck veins. Respiratory: Lungs are clear to auscultation.  Skin:  Without evidence of ankle edema, or rash. Trunk: The patient's posture is erect.   Neurologic exam : The patient is awake and alert, oriented to place and time.   Memory subjective described as intact.  Attention span & concentration ability appears normal.  Speech is fluent,  without  dysarthria, dysphonia or aphasia.  Mood and affect are appropriate.   Cranial nerves: no loss of smell or taste reported  Pupils are equal and briskly reactive to light. Funduscopic exam deferred. .  Extraocular movements in vertical and horizontal planes were intact and without nystagmus. No Diplopia. Visual fields by finger perimetry are intact. Hearing was intact to soft voice and finger rubbing.    Facial sensation intact to fine touch.  Facial motor strength is symmetric and tongue and uvula move midline.  Neck ROM : rotation, tilt and flexion extension were normal for age and shoulder shrug was symmetrical.    Motor exam:  Symmetric bulk, tone and ROM.   Normal tone without cog-wheeling, and there was symmetric grip strength .   Sensory:  Fine touch, pinprick and vibration were normal.  Proprioception tested in the upper extremities was normal.   Coordination: Rapid alternating movements in the fingers/hands were of normal speed.  The Finger-to-nose maneuver was intact without evidence of ataxia, dysmetria and at least here today- NO  tremor.   Gait and station: Patient could rise unassisted  from a seated position, walked without assistive device.  Stance is of normal width/ base and the patient turned with 3 steps.  Toe and heel walk were deferred.  Deep tendon reflexes: in the  upper and lower extremities are symmetric and intact.  Babinski response was deferred .      After spending a total time of 35 minutes face to face and additional time for physical and neurologic examination, review of laboratory studies,  personal review of imaging studies, reports and results of other testing and review of referral information / records as far as provided in visit, I have established the following assessments:    1)  Hypersomnia in form of long sleep time and additional naps at any time- anywhere, unplanned. She has had parasomnia activities.  2) frequent morning headaches  That are different from her migraines. No palpitations.  3) nocturia , many times.  4) caretaker to a family member each night.    My Plan is to proceed with:  1)  Parasomnia montage PSG.    I would like to thank Dr . 05/08/20,  10-27-1971, Lucia Gaskins, MD for allowing me to meet with and to take care of this pleasant patient.   In short, Rene Kocher A Crickenberger is presenting with  snoring, hypersomnia with long sleep time, EDS and parasomnia activity as well as Nocturia and , some symptom that can be attributed to OSA. .   I plan to follow up either personally or through our NP within 2-3  month.   CC: I will share my notes with Dr Ahern..  Electronically signed by: Melvyn Novasarmen Dez Stauffer, MD 11/1/2Lucia Gaskins021 9:14 AM  [facility] and [facility] certified by The [employer] Medicine and Diplomate of the [employer]. Board certified In Neurology through the ABPN, Fellow of the [employer]. Medical Director of [facility].

## 2020-06-23 ENCOUNTER — Ambulatory Visit (INDEPENDENT_AMBULATORY_CARE_PROVIDER_SITE_OTHER): Payer: BC Managed Care – PPO | Admitting: Neurology

## 2020-06-23 DIAGNOSIS — G4761 Periodic limb movement disorder: Secondary | ICD-10-CM

## 2020-06-23 DIAGNOSIS — G4711 Idiopathic hypersomnia with long sleep time: Secondary | ICD-10-CM

## 2020-06-23 DIAGNOSIS — R351 Nocturia: Secondary | ICD-10-CM

## 2020-06-23 DIAGNOSIS — F19982 Other psychoactive substance use, unspecified with psychoactive substance-induced sleep disorder: Secondary | ICD-10-CM

## 2020-06-23 DIAGNOSIS — G4719 Other hypersomnia: Secondary | ICD-10-CM

## 2020-06-23 DIAGNOSIS — G4733 Obstructive sleep apnea (adult) (pediatric): Secondary | ICD-10-CM

## 2020-06-23 DIAGNOSIS — R519 Headache, unspecified: Secondary | ICD-10-CM

## 2020-07-03 DIAGNOSIS — G4733 Obstructive sleep apnea (adult) (pediatric): Secondary | ICD-10-CM | POA: Insufficient documentation

## 2020-07-03 DIAGNOSIS — G4761 Periodic limb movement disorder: Secondary | ICD-10-CM | POA: Insufficient documentation
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
## 2020-07-03 NOTE — Addendum Note (Signed)
Addended by: Melvyn Novas on: 07/03/2020 02:54 PM   Modules accepted: Orders

## 2020-07-03 NOTE — Progress Notes (Signed)
IMPRESSION:   1. Mild, Mostly Obstructive Sleep Apnea with some central apneas,  strongly dependent on supine sleep and only mildly accentuated by  REM sleep. There was no related hypoxia in sleep.  2. Sleep talking with normal REM sleep EEG.  3. Severe Periodic Limb Movement Disorder (PLMD), which was much  milder during REM sleep. PLM arousal index was 7.1/h. few let to  wakefulness, but many PLMs were causing transient EEG changes.  4. Severe Snoring was evident.  5. Normal EKG, EEG.   RECOMMENDATIONS:   1. The high PLM count can be related to neuropathy (thyroid  disease, diabetes, toxic substances, vitamin deficiencies), to  DDD, spinal stenosis, radiculopathy, CVA, or iron deficiency. It  is advisable to first differentiate these causes before placing  this patient on medication.  2. I will advise a full night, attended, PAP titration study to  optimize therapy for snoring and apnea. It may help the frequent  PLMs. PS: This patient cannot benefit from a dental device  therapy for apnea, is edentulous.

## 2020-07-08 ENCOUNTER — Telehealth: Payer: Self-pay | Admitting: Neurology

## 2020-07-08 NOTE — Telephone Encounter (Signed)
Called patient's daughter (DPR) to discuss sleep study results. No answer at this time. LVM for the patient to call back.

## 2020-07-08 NOTE — Telephone Encounter (Signed)
-----   Message from Melvyn Novas, MD sent at 07/03/2020  2:54 PM EST ----- IMPRESSION:   1. Mild, Mostly Obstructive Sleep Apnea with some central apneas,  strongly dependent on supine sleep and only mildly accentuated by  REM sleep. There was no related hypoxia in sleep.  2. Sleep talking with normal REM sleep EEG.  3. Severe Periodic Limb Movement Disorder (PLMD), which was much  milder during REM sleep. PLM arousal index was 7.1/h. few let to  wakefulness, but many PLMs were causing transient EEG changes.  4. Severe Snoring was evident.  5. Normal EKG, EEG.   RECOMMENDATIONS:   1. The high PLM count can be related to neuropathy (thyroid  disease, diabetes, toxic substances, vitamin deficiencies), to  DDD, spinal stenosis, radiculopathy, CVA, or iron deficiency. It  is advisable to first differentiate these causes before placing  this patient on medication.  2. I will advise a full night, attended, PAP titration study to  optimize therapy for snoring and apnea. It may help the frequent  PLMs. PS: This patient cannot benefit from a dental device  therapy for apnea, is edentulous.

## 2020-07-14 NOTE — Telephone Encounter (Signed)
Called the patient's daughter and reviewed the study with her. She was already aware of the findings and has been scheduled for the titration study.

## 2020-07-23 ENCOUNTER — Ambulatory Visit (INDEPENDENT_AMBULATORY_CARE_PROVIDER_SITE_OTHER): Payer: BC Managed Care – PPO | Admitting: Neurology

## 2020-07-23 ENCOUNTER — Other Ambulatory Visit: Payer: Self-pay

## 2020-07-23 DIAGNOSIS — G4733 Obstructive sleep apnea (adult) (pediatric): Secondary | ICD-10-CM

## 2020-07-23 DIAGNOSIS — R519 Headache, unspecified: Secondary | ICD-10-CM

## 2020-07-23 DIAGNOSIS — R351 Nocturia: Secondary | ICD-10-CM

## 2020-07-23 DIAGNOSIS — G4761 Periodic limb movement disorder: Secondary | ICD-10-CM

## 2020-07-23 DIAGNOSIS — G4711 Idiopathic hypersomnia with long sleep time: Secondary | ICD-10-CM

## 2020-07-23 DIAGNOSIS — G4719 Other hypersomnia: Secondary | ICD-10-CM

## 2020-07-23 DIAGNOSIS — F19982 Other psychoactive substance use, unspecified with psychoactive substance-induced sleep disorder: Secondary | ICD-10-CM

## 2020-07-31 NOTE — Addendum Note (Signed)
Addended by: Melvyn Novas on: 07/31/2020 11:45 AM   Modules accepted: Orders

## 2020-07-31 NOTE — Procedures (Signed)
PATIENT'S NAME:  Kimberly Ellison, Kimberly Ellison. DOB:      [birth year]      MR#:    [id number]     DATE OF RECORDING: 07/23/2020 Josefina Do REFERRING M.D.:  Naomie Dean, MD Study Performed:   Titration HISTORY:  Kimberly Ellison Smyth is Ellison 64 year- old edentulous Female patient and was seen upon referral on 06/22/2020, per Dr Lucia Gaskins, by whom she is followed for headaches.   She returned for an attended CPAP titration study following her polysomnography from 11.2.2021, which diagnosed:  1. Mild, Mostly Obstructive Sleep Apnea with some central apneas,  strongly dependent on supine sleep and only mildly accentuated by  REM sleep. There was no related hypoxia in sleep. Severe Snoring was evident.  2. Sleep talking with normal REM sleep EEG.  3. Severe Periodic Limb Movement Disorder (PLMD), which was much  milder during REM sleep. PLM arousal index was 7.1/h. few let to  wakefulness, but many PLMs were causing transient EEG changes.   The patient endorsed the Epworth Sleepiness Scale at 15 points.   The patient's weight 176 pounds with Ellison height of 65 (inches), resulting in Ellison BMI of 29.4 kg/m2. The patient's neck circumference measured 16 inches.  CURRENT MEDICATIONS:  Proventil, Aspirin, Lipitor, Oscal, Aricept, Pepcid, Emgality, Lamictal, Melatonin, Mobic, Rheumatrex, Remeron, Singulair, Multivitamin, Zyprexa, Zofran, Protonix, Phenergan, Nurtec.    PROCEDURE:  This is Ellison multichannel digital polysomnogram utilizing the SomnoStar 11.2 system.  Electrodes and sensors were applied and monitored per AASM Specifications.   EEG, EOG, Chin and Limb EMG, were sampled at 200 Hz.  ECG, Snore and Nasal Pressure, Thermal Airflow, Respiratory Effort, CPAP Flow and Pressure, Oximetry was sampled at 50 Hz. Digital video and audio were recorded.       CPAP was initiated under Ellison ResMed Nasal pillow in small size- at 5 cmH20 with heated humidity per AASM standards .This pressure was advanced to 9 cmH20 (with expiratory pressure relief  ( EPR)  of 2 cm water) because of hypopneas, apneas and desaturations.  At Ellison final PAP pressure of 9 cmH20,2 cm EPR there was Ellison reduction of the AHI to 0.4/h with improvement of sleep apnea.  Lights Out was at 20:48 and Lights On at 04:00. Total recording time (TRT) was 432.5 minutes, with Ellison total sleep time (TST) of 279.5 minutes. The patient's sleep latency was 33 minutes. REM latency was 38 minutes.  The sleep efficiency was 64.6 %.    SLEEP ARCHITECTURE: WASO (Wake after sleep onset) was still high at 132 minutes.  There were 38.5 minutes in Stage N1, 120.5 minutes Stage N2, 9 minutes Stage N3 and 111.5 minutes in Stage REM.  The percentage of Stage N1 was 13.8%, Stage N2 was 43.1%, Stage N3 was 3.2% and Stage R (REM sleep) was 39.9%. The sleep architecture was notable for early REM rebound and much less PLMs than in her baseline study.   RESPIRATORY ANALYSIS:  There was Ellison total of 22 respiratory events: 0 apneas and 22 hypopneas with Ellison hypopnea index of 4.7/hour.   The total APNEA/HYPOPNEA INDEX (AHI) was 4.7 /hour. 7 events occurred in REM sleep and 15 events in NREM. The REM AHI was 3.8 /hour versus Ellison non-REM AHI of 5.4 /hour.  The patient spent 96.5 minutes of total sleep time in the supine position and 183 minutes in non-supine. The supine AHI was 2.5, versus Ellison non-supine AHI of 5.9.  OXYGEN SATURATION & C02:  The baseline 02 saturation was 96%, with  the lowest being 91%. Time spent below 89% saturation equaled 0 minutes. The arousals were noted as: 23 were spontaneous, 0 were associated with PLMs, 17 were associated with respiratory events. The patient had Ellison total of 21 Periodic Limb Movements. The Periodic Limb Movement (PLM) Arousal index was 0 /hour. Audio and video analysis did not show any abnormal or unusual movements, behaviors, phonations or vocalizations.  EKG was in keeping with normal sinus rhythm (NSR).  DIAGNOSIS 1. Primary Snoring, Obstructive Sleep Apnea were alleviated under  CPAP of 9 cm water with 2 cm EPR and under Ellison ResMed Nasal pillow in small size. The patient had shown REM sleep in lateral and supine position to be controlled by CPAP.   2. No treatment emergent Central Sleep Apnea was recorded. 3. Periodic Limb Movements resolved under CPAP (!) These had been much more prominent in the baseline study.    PLANS/RECOMMENDATIONS: Auto CPAP device will be ordered with heated humidification, 5-12 cm water with 2 cm EPR, under Ellison ResMed Nasal pillow in small size 1. All apnea patients should avoid sedatives, hypnotics, and alcohol consumption at bedtime.  2. CPAP therapy compliance is defined as 4 hours or more of nightly use.   DISCUSSION: Ellison follow up appointment will be scheduled in the Sleep Clinic at [facility].   Please call [phone] with any questions.     I certify that I have reviewed the entire raw data recording prior to the issuance of this report in accordance with the Standards of Accreditation of the American Academy of Sleep Medicine (AASM)  Melvyn Novas, M.D. Diplomat, Biomedical engineer of Neurology (ABPN) and Biomedical engineer of Sleep Medicine Wellsite geologist, [facility]

## 2020-07-31 NOTE — Progress Notes (Signed)
DIAGNOSIS 1. Primary Snoring, Obstructive Sleep Apnea were alleviated under CPAP of 9 cm water with 2 cm EPR and under a ResMed Nasal pillow in small size. The patient had shown REM sleep in lateral and supine position to be controlled by CPAP.   2. No treatment emergent Central Sleep Apnea was recorded. 3. Periodic Limb Movements resolved under CPAP (!) These had been much more prominent in the baseline study.    PLANS/RECOMMENDATIONS: Auto CPAP device will be ordered with heated humidification, 5-12 cm water with 2 cm EPR, under a ResMed Nasal pillow in small size 1. All apnea patients should avoid sedatives, hypnotics, and alcohol consumption at bedtime.  2. CPAP therapy compliance is defined as 4 hours or more of nightly use.

## 2020-08-03 ENCOUNTER — Telehealth: Payer: Self-pay | Admitting: Neurology

## 2020-08-03 NOTE — Telephone Encounter (Signed)
-----   Message from Melvyn Novas, MD sent at 07/31/2020 11:45 AM EST ----- DIAGNOSIS 1. Primary Snoring, Obstructive Sleep Apnea were alleviated under CPAP of 9 cm water with 2 cm EPR and under a ResMed Nasal pillow in small size. The patient had shown REM sleep in lateral and supine position to be controlled by CPAP.   2. No treatment emergent Central Sleep Apnea was recorded. 3. Periodic Limb Movements resolved under CPAP (!) These had been much more prominent in the baseline study.    PLANS/RECOMMENDATIONS: Auto CPAP device will be ordered with heated humidification, 5-12 cm water with 2 cm EPR, under a ResMed Nasal pillow in small size 1. All apnea patients should avoid sedatives, hypnotics, and alcohol consumption at bedtime.  2. CPAP therapy compliance is defined as 4 hours or more of nightly use.

## 2020-08-03 NOTE — Telephone Encounter (Signed)
I called pt's daughter. I advised pt that Dr. Vickey Huger reviewed their sleep study results and found that pt best treated on CPAP at pressure of 9 cm water pressure. Dr. Vickey Huger recommends that pt start auto CPAP 5-12 cm water pressure. I reviewed PAP compliance expectations with the pt's duaghter. Pt's daughter is agreeable to pt starting a CPAP. I advised pt that an order will be sent to a DME, [facility] and [facility] will call the pt within about one week after they file with the pt's insurance. [facility]) will show the pt how to use the machine, fit for masks, and troubleshoot the CPAP if needed. A follow up appt will need to be made for insurance purposes with Dr. Vickey Huger or the NP. Pt's daughter verbalized understanding to call once they have an apt with the DME company to schedule initial CPAP visit 31-90 days from that date. A letter with all of this information in it will be mailed to the pt's daugter as a reminder. Daughter provided me with her address. Pt's daughter verbalized understanding of results. Pt's daughter had no questions at this time but was encouraged to call back if questions arise. I have sent the order to [facility]) and have received confirmation that they have received the order.

## 2020-08-24 NOTE — Telephone Encounter (Signed)
Received another Nurtec PA request. Completed on Cover My Meds. Key: [id number]. Awaiting determination from [employer].

## 2020-09-02 ENCOUNTER — Encounter: Payer: Self-pay | Admitting: *Deleted

## 2020-09-02 NOTE — Telephone Encounter (Signed)
I spoke with Dr Lucia Gaskins. We can try to switch to [place] and see if insurance will cover that instead if this is ok with pt/daughter. Insurance does prefer [place]. I called Desiree (on DPR) and LVM (ok per DPR) asking her if she would be ok with [place] trying to get Bernita Raisin covered instead which is a sister medication to the Nurtec that is not being covered. I left the office number for call back.

## 2020-09-02 NOTE — Telephone Encounter (Signed)
Error

## 2020-09-09 ENCOUNTER — Telehealth: Payer: Self-pay | Admitting: *Deleted

## 2020-09-09 NOTE — Telephone Encounter (Addendum)
Approved effective from 09/09/2020 through 12/01/2020.

## 2020-09-09 NOTE — Telephone Encounter (Signed)
Received a Bernita Raisin PA. Completed this on Cover My Meds. Key: [id number]. Awaiting determination from [employer].

## 2020-09-26 ENCOUNTER — Other Ambulatory Visit: Payer: Self-pay | Admitting: Adult Health

## 2020-11-03 ENCOUNTER — Ambulatory Visit: Payer: BC Managed Care – PPO | Admitting: Adult Health

## 2020-11-16 ENCOUNTER — Ambulatory Visit: Payer: BC Managed Care – PPO | Admitting: Adult Health

## 2020-11-30 ENCOUNTER — Other Ambulatory Visit: Payer: Self-pay | Admitting: Adult Health

## 2020-12-01 ENCOUNTER — Other Ambulatory Visit: Payer: Self-pay | Admitting: Orthopedic Surgery

## 2020-12-01 DIAGNOSIS — M533 Sacrococcygeal disorders, not elsewhere classified: Secondary | ICD-10-CM

## 2020-12-16 ENCOUNTER — Other Ambulatory Visit: Payer: Self-pay

## 2020-12-16 ENCOUNTER — Ambulatory Visit
Admission: RE | Admit: 2020-12-16 | Discharge: 2020-12-16 | Disposition: A | Discharge status: Home / Self Care | Payer: BC Managed Care – PPO | Source: Ambulatory Visit | Attending: Orthopedic Surgery | Admitting: Orthopedic Surgery

## 2020-12-16 DIAGNOSIS — M533 Sacrococcygeal disorders, not elsewhere classified: Secondary | ICD-10-CM

## 2021-04-01 ENCOUNTER — Telehealth: Payer: Self-pay | Admitting: Adult Health

## 2021-04-01 ENCOUNTER — Ambulatory Visit: Payer: BC Managed Care – PPO | Admitting: Adult Health

## 2021-04-01 NOTE — Telephone Encounter (Signed)
Kimberly Ellison - This patient keeps cancelling her appointments and her followup was supposed to be done in March. She still doesn't have her Cpap and her daughter is concerned and really wanted to get her in earlier. You don't have anything until 12/20 and she was added to the Cancellation list. If you can review her chart and let me know if an admin spot can be used or if we should just leave as in on the cancellation. The patients daughter is on the Dpr and wanted to try to get in contact with you on Mychart. Thank you

## 2021-04-19 ENCOUNTER — Ambulatory Visit: Payer: BC Managed Care – PPO | Admitting: Neurology

## 2021-05-15 ENCOUNTER — Other Ambulatory Visit: Payer: Self-pay | Admitting: Adult Health

## 2021-06-22 ENCOUNTER — Ambulatory Visit: Payer: BC Managed Care – PPO | Admitting: Family Medicine

## 2021-08-10 ENCOUNTER — Ambulatory Visit: Payer: BC Managed Care – PPO | Admitting: Adult Health

## 2021-08-11 ENCOUNTER — Telehealth: Payer: Self-pay | Admitting: Neurology

## 2021-08-11 NOTE — Telephone Encounter (Addendum)
I spoke to MM/NP about appt.  Made change to MM/NP (opening tomorrow at 1000 arrive 0930) for MMSE. Spoke to [place], daughter of pt.  She was appreciative.   [place] machine, (wireless).

## 2021-08-11 NOTE — Telephone Encounter (Signed)
Pt's daughter called states her mother is declining very bad. Pt's daughter is requesting a call back.Kimberly Ellison

## 2021-08-11 NOTE — Telephone Encounter (Signed)
I called spoke to daughter of pt.  She is very frustrated with her mother right now.  Pt has dx of bipolar.  She is not sleeping well. Is not using her cpap  (its uncomfortable).  Lives with sister, who is not really physically able to care for her. She see psych as well.   Pt has dementia as well.  Apparently had cancelled appt that was for yesterday. Daughter was not aware.  Has appt 09-06-21 with AL/NP. (Who she has not seen before (had been seeing MM/NP).  I relayed that this I believe is for sleep issues.  I offered her some dementia resources when they come in for appt.  I did not change the appt.

## 2021-08-12 ENCOUNTER — Ambulatory Visit: Payer: BC Managed Care – PPO | Admitting: Adult Health

## 2021-08-12 ENCOUNTER — Encounter: Payer: Self-pay | Admitting: Adult Health

## 2021-08-12 VITALS — BP 123/82 | HR 83 | Ht 65.0 in | Wt 178.2 lb

## 2021-08-12 DIAGNOSIS — Z9989 Dependence on other enabling machines and devices: Secondary | ICD-10-CM

## 2021-08-12 DIAGNOSIS — G4733 Obstructive sleep apnea (adult) (pediatric): Secondary | ICD-10-CM | POA: Diagnosis not present

## 2021-08-12 DIAGNOSIS — G43009 Migraine without aura, not intractable, without status migrainosus: Secondary | ICD-10-CM

## 2021-08-12 DIAGNOSIS — R413 Other amnesia: Secondary | ICD-10-CM

## 2021-08-12 NOTE — Patient Instructions (Signed)
Your Plan:  Continue Aricept 10 mg at bedtime Retry CPAP     Thank you for coming to see [place] at [facility]. I hope we have been able to provide you high quality care today.  You may receive a patient satisfaction survey over the next few weeks. We would appreciate your feedback and comments so that we may continue to improve ourselves and the health of our patients.

## 2021-08-12 NOTE — Progress Notes (Signed)
PATIENT: Kimberly Ellison DOB: [birth year]  REASON FOR VISIT: follow up HISTORY FROM: patient PRIMARY NEUROLOGIST:   HISTORY OF PRESENT ILLNESS: Today 08/12/21:  Kimberly Ellison is a 65 year old female with a history of obstructive sleep apnea on CPAP, migraine headaches and memory disturbance.  She returns today for follow-up.  She is here with her daughter.  The daughter is concerned that she should not be living by herself and taking care of her elderly sister.  The patient lives in an apartment with her sister.  She does complete all ADLs independently.  Daughter states that she forgets to take her medication.  Her medications are in a pill pack.  She states that she is also canceling her appointments.  Remains on Aricept 10 mg at bedtime  Patient states that her headaches have been under good control.  She is no longer on Emgality or Nurtec.  States that she has had trouble using the CPAP.  Reports that she has trouble falling asleep with it on.  She does have the nasal pillows.    REVIEW OF SYSTEMS: Out of a complete 14 system review of symptoms, the patient complains only of the following symptoms, and all other reviewed systems are negative.  ALLERGIES: Allergies  Allergen Reactions   Penicillins Hives    HOME MEDICATIONS: Outpatient Medications Prior to Visit  Medication Sig Dispense Refill   acetaminophen (TYLENOL) 500 MG tablet Take 500-1,500 mg by mouth 3 (three) times daily as needed.      albuterol (PROVENTIL HFA;VENTOLIN HFA) 108 (90 Base) MCG/ACT inhaler Inhale 2 puffs into the lungs every 4 (four) hours as needed for wheezing or shortness of breath. 1 Inhaler 2   aspirin EC 81 MG tablet Take 81 mg by mouth daily. Swallow whole.     atorvastatin (LIPITOR) 10 MG tablet Take 1 tablet by mouth daily.  1   calcium carbonate (OSCAL) 1500 (600 Ca) MG TABS tablet Take 1,500 mg by mouth daily.     cyanocobalamin 500 MCG tablet Take 500 mcg by mouth 2 (two) times daily.       donepezil (ARICEPT) 10 MG tablet TAKE 1 TABLET BY MOUTH AT BEDTIME 30 tablet 2   famotidine (PEPCID) 20 MG tablet One at bedtime 30 tablet 2   lamoTRIgine (LAMICTAL) 100 MG tablet Take 150 mg by mouth daily.      Melatonin 3 MG TABS Take 3 mg by mouth at bedtime.     mirtazapine (REMERON) 7.5 MG tablet Take 15 mg by mouth at bedtime.     montelukast (SINGULAIR) 10 MG tablet Take 10 mg by mouth daily.     Multiple Vitamin (MULTIVITAMIN) capsule Take 1 capsule by mouth daily.     OLANZapine (ZYPREXA) 20 MG tablet Take 20 mg by mouth 2 (two) times daily.     pantoprazole (PROTONIX) 40 MG tablet Take 1 tablet (40 mg total) by mouth daily. Take 30-60 min before first meal of the day 30 tablet 2   promethazine (PHENERGAN) 25 MG tablet Take 12.5 mg by mouth every 8 (eight) hours as needed for nausea or vomiting.     Galcanezumab-gnlm (EMGALITY) 120 MG/ML SOAJ Inject 120 mg into the skin every 30 (thirty) days. 2 mL 0   meloxicam (MOBIC) 7.5 MG tablet Take 7.5 mg by mouth daily.     methotrexate (RHEUMATREX) 2.5 MG tablet Take 2.5 mg by mouth once a week. Caution:Chemotherapy. Protect from light.     ondansetron (ZOFRAN-ODT) 4 MG disintegrating tablet  Take 4 mg by mouth every 8 (eight) hours as needed.     Rimegepant Sulfate (NURTEC) 75 MG TBDP Take 75 mg by mouth every other day. For migraines. 16 tablet 6   No facility-administered medications prior to visit.    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 unsure if intentional.   Transient cerebral ischemia     PAST SURGICAL  HISTORY: Past Surgical History:  Procedure Laterality Date   ABDOMINAL HYSTERECTOMY  1999   WISDOM TOOTH EXTRACTION      FAMILY HISTORY: Family History  Problem Relation Age of Onset   Lymphoma Mother    Hypertension Mother    Stroke Mother    Dementia Father    Hypertension Sister    Diabetes Sister    Sleep apnea Maternal Aunt    Diabetes Daughter     SOCIAL HISTORY: Social History   Socioeconomic History   Marital status: Divorced    Spouse name: Not on file   Number of children: 1   Years of education: 10   Highest education level: Not on file  Occupational History    Comment: [employer]  Tobacco Use   Smoking status: Former    Packs/day: 2.00    Years: 46.00    Pack years: 92.00    Types: Cigarettes    Quit date: 04/04/2016    Years since quitting: 5.3   Smokeless tobacco: Never  Vaping Use   Vaping Use: Never used  Substance and Sexual Activity   Alcohol use: No   Drug use: No    Comment: hx cocaine, marijuana    Sexual activity: Not on file  Other Topics Concern   Not on file  Social History Narrative   Lives alone. Sister lives with her and the patient takes care of her sister.    Caffeine: stopped abruptly about 3 weeks ago    Right handed   Social Determinants of Health   Financial Resource Strain: Not on file  Food Insecurity: Not on file  Transportation Needs: Not on file  Physical Activity: Not on file  Stress: Not on file  Social Connections: Not on file  Intimate Partner Violence: Not on file      PHYSICAL EXAM  Vitals:   08/12/21 0953  BP: 123/82  Pulse: 83  Weight: 178 lb 3.2 oz (80.8 kg)  Height: 5\' 5"  (1.651 m)   Body mass index is 29.65 kg/m.  MMSE - Mini Mental State Exam 08/12/2021 05/06/2020 10/29/2019  Not completed: - - (No Data)  Orientation to time 5 4 5   Orientation to Place 4 4 3   Registration 3 3 3   Attention/ Calculation 3 5 5   Recall 3 3 2   Language- name 2 objects 2 2 2   Language- repeat 1 1 1    Language- follow 3 step command 3 3 3   Language- read & follow direction 1 1 1   Write a sentence 1 1 1   Copy design 1 1 1   Total score 27 28  27     Generalized: Well developed, in no acute distress   Neurological examination  Mentation: Alert. Follows all commands speech and language fluent Cranial nerve II-XII: Pupils were equal round reactive to light. Extraocular movements were full, visual field were full on confrontational test. Facial sensation and strength were normal. Uvula tongue midline. Head turning and shoulder shrug  were normal and symmetric. Motor: The motor testing reveals 5 over 5 strength of all 4 extremities. Good symmetric motor tone is noted throughout.  Sensory: Sensory testing is intact to soft touch on all 4 extremities. No evidence of extinction is noted.  Coordination: Cerebellar testing reveals good finger-nose-finger and heel-to-shin bilaterally.  Gait and station: Gait is normal.  Reflexes: Deep tendon reflexes are symmetric and normal bilaterally.   DIAGNOSTIC DATA (LABS, IMAGING, TESTING) - I reviewed patient records, labs, notes, testing and imaging myself where available.  Lab Results  Component Value Date   WBC 8.5 05/14/2020   HGB 13.4 05/14/2020   HCT 38.7 05/14/2020   MCV 109 (H) 05/14/2020   PLT 305 05/14/2020      Component Value Date/Time   NA 134 05/19/2020 1010   K 4.8 05/19/2020 1010   CL 96 05/19/2020 1010   CO2 21 05/19/2020 1010   GLUCOSE 139 (H) 05/19/2020 1010   BUN 12 05/19/2020 1010   CREATININE 0.77 05/19/2020 1010   CALCIUM 11.6 (H) 05/19/2020 1010   PROT 6.7 05/14/2020 1414   ALBUMIN 4.6 05/14/2020 1414   AST 21 05/14/2020 1414   ALT 23 05/14/2020 1414   ALKPHOS 75 05/14/2020 1414   BILITOT 0.2 05/14/2020 1414   GFRNONAA 83 05/19/2020 1010   GFRAA 96 05/19/2020 1010   Lab Results  Component Value Date   VITAMINB12 1,037 05/19/2020   Lab Results  Component Value Date   TSH 2.680 05/14/2020       ASSESSMENT AND PLAN 65 y.o. year old female  has a past medical history of Anemia, Asthma, Bipolar 1 disorder (HCC), Constipation, COPD (chronic obstructive pulmonary disease) (HCC), Dementia (HCC), Diabetes (HCC), DJD (degenerative joint disease), GERD (gastroesophageal reflux disease), Headache, new daily persistent (NDPH) (04/2020), Infection (2020), Insomnia, Overdose (08/2018), and Transient cerebral ischemia. here with:  1.  Obstructive sleep apnea on CPAP  Patient encouraged to retry CPAP Advised patient to try to use his CPAP during the day for 30 minutes for desensitization Advised patient that she should try using machine nightly and for the entire time she is sleeping  2.  Migraine headaches  Stable, currently not on any medication  3.  Memory disturbance  Continue Aricept 10 mg at bedtime MMSE is stable Daughter has some concerns about the patient living by herself.  Patient would benefit from more assistance.  I provided the patient and her daughter with resources.  Follow-up in 6 months or sooner if needed     Butch Penny, MSN, NP-C 08/12/2021, 10:06 AM [facility] [address] [place], [place] [address] [phone]

## 2021-09-06 ENCOUNTER — Ambulatory Visit: Payer: BC Managed Care – PPO | Admitting: Family Medicine

## 2021-09-12 ENCOUNTER — Other Ambulatory Visit: Payer: Self-pay | Admitting: Adult Health

## 2022-02-16 ENCOUNTER — Ambulatory Visit: Payer: 59 | Admitting: Adult Health

## 2022-02-16 ENCOUNTER — Encounter: Payer: Self-pay | Admitting: Adult Health

## 2022-02-16 VITALS — BP 129/79 | HR 77 | Ht 65.0 in | Wt 192.6 lb

## 2022-02-16 DIAGNOSIS — G4733 Obstructive sleep apnea (adult) (pediatric): Secondary | ICD-10-CM

## 2022-02-16 DIAGNOSIS — R413 Other amnesia: Secondary | ICD-10-CM | POA: Diagnosis not present

## 2022-02-16 NOTE — Patient Instructions (Signed)
Your Plan: ° °Continue Aricept 10 mg at bedtime °If your symptoms worsen or you develop new symptoms please let us know.  ° ° °Thank you for coming to see us at [facility]. I hope we have been able to provide you high quality care today. ° °You may receive a patient satisfaction survey over the next few weeks. We would appreciate your feedback and comments so that we may continue to improve ourselves and the health of our patients. ° °

## 2022-02-16 NOTE — Progress Notes (Signed)
PATIENT: Kimberly Ellison DOB: 1956/07/26  REASON FOR VISIT: follow up HISTORY FROM: patient PRIMARY NEUROLOGIST: Kimberly Ellison Neurologist: Dr. Vickey Ellison   Chief Complaint  Patient presents with   Follow-up    Rm 5, daughter- Kimberly Ellison.  Pt is not using luna, only tried once mask, (nasal).  Does not want to try.  Has not used for 9 months.  Could not afford emaglity nurtec. (Has not had migraines for 6-9 months).  Memory-     HISTORY OF PRESENT ILLNESS: Today 02/16/22: Kimberly Ellison is a 66 year old female with a history of OSA, memory disturbance and headches.   Memory: Daughter feels that memory is slightly worse. Patient feels that memory is the same. Lives with her sister. Still taking care of older sister. Able to complete all ADLs independently. 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 Sometimes patient makes appt but doesn't tell her daughter. No change in mood or behavior. Daughter feels that she struggles with common sense scenarios. Repots that she is reactive instead of rational. Daughter reports that she has trouble reasoning with patient. Sleep ok. Still taking on Aricept 10 mg. Followed by psychiatry- daughter reports that want to put her on lithium   Migraines: no longer having any headaches  OSA: no longer using CPAP. Does not plan to restart  08/12/21: Kimberly Ellison is a 66 year old female with a history of obstructive sleep apnea on CPAP, migraine headaches and memory disturbance.  She returns today for follow-up.  She is here with her daughter.  The daughter is concerned that she should not be living by herself and taking care of her elderly sister.  The patient lives in an apartment with her sister.  She does complete all ADLs independently.  Daughter states that she forgets to take her medication.  Her medications are in a pill pack.  She states that she is also canceling her appointments.  Remains on Aricept 10 mg at bedtime  Patient states  that her headaches have been under good control.  She is no longer on Emgality or Nurtec.  States that she has had trouble using the CPAP.  Reports that she has trouble falling asleep with it on.  She does have the nasal pillows.    REVIEW OF SYSTEMS: Out of a complete 14 system review of symptoms, the patient complains only of the following symptoms, and all other reviewed systems are negative.  ALLERGIES: Allergies  Allergen Reactions   Penicillins Hives    HOME MEDICATIONS: Outpatient Medications Prior to Visit  Medication Sig Dispense Refill   acetaminophen (TYLENOL) 500 MG tablet Take 500-1,500 mg by mouth 3 (three) times daily as needed.      albuterol (PROVENTIL HFA;VENTOLIN HFA) 108 (90 Base) MCG/ACT inhaler Inhale 2 puffs into the lungs every 4 (four) hours as needed for wheezing or shortness of breath. 1 Inhaler 2   aspirin EC 81 MG tablet Take 81 mg by mouth daily. Swallow whole.     atorvastatin (LIPITOR) 10 MG tablet Take 1 tablet by mouth daily.  1   calcium carbonate (OSCAL) 1500 (600 Ca) MG TABS tablet Take 1,500 mg by mouth daily.     cyanocobalamin 500 MCG tablet Take 500 mcg by mouth 2 (two) times daily.      donepezil (ARICEPT) 10 MG tablet TAKE 1 TABLET BY MOUTH AT BEDTIME 30 tablet 5   famotidine (PEPCID) 20 MG tablet One at bedtime 30 tablet 2   Galcanezumab-gnlm (  EMGALITY) 120 MG/ML SOAJ Inject 120 mg into the skin every 30 (thirty) days. 2 mL 0   lamoTRIgine (LAMICTAL) 100 MG tablet Take 150 mg by mouth daily.      Melatonin 3 MG TABS Take 3 mg by mouth at bedtime.     meloxicam (MOBIC) 7.5 MG tablet Take 7.5 mg by mouth daily.     methotrexate (RHEUMATREX) 2.5 MG tablet Take 2.5 mg by mouth once a week. Caution:Chemotherapy. Protect from light.     mirtazapine (REMERON) 7.5 MG tablet Take 15 mg by mouth at bedtime.     montelukast (SINGULAIR) 10 MG tablet Take 10 mg by mouth daily.     Multiple Vitamin (MULTIVITAMIN) capsule Take 1 capsule by mouth daily.      OLANZapine (ZYPREXA) 20 MG tablet Take 20 mg by mouth 2 (two) times daily.     ondansetron (ZOFRAN-ODT) 4 MG disintegrating tablet Take 4 mg by mouth every 8 (eight) hours as needed.     pantoprazole (PROTONIX) 40 MG tablet Take 1 tablet (40 mg total) by mouth daily. Take 30-60 min before first meal of the day 30 tablet 2   promethazine (PHENERGAN) 25 MG tablet Take 12.5 mg by mouth every 8 (eight) hours as needed for nausea or vomiting.     Rimegepant Sulfate (NURTEC) 75 MG TBDP Take 75 mg by mouth every other day. For migraines. 16 tablet 6   No facility-administered medications prior to visit.    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 unsure if intentional.   Transient cerebral ischemia     PAST SURGICAL HISTORY: Past Surgical History:  Procedure Laterality Date   ABDOMINAL HYSTERECTOMY  1999   WISDOM TOOTH EXTRACTION      FAMILY HISTORY: Family History  Problem Relation Age of Onset   Lymphoma Mother    Hypertension Mother    Stroke Mother    Dementia Father    Hypertension Sister    Diabetes Sister    Sleep apnea Maternal Aunt    Diabetes Daughter     SOCIAL HISTORY: Social History   Socioeconomic History   Marital status: Divorced    Spouse name: Not on file   Number of children: 1   Years of education: 10   Highest education level: Not on file  Occupational History    Comment: [employer]  Tobacco Use   Smoking status: Former    Packs/day: 2.00    Years: 46.00    Total pack years: 92.00    Types: Cigarettes    Quit date: 04/04/2016     Years since quitting: 5.8   Smokeless tobacco: Never  Vaping Use   Vaping Use: Never used  Substance and Sexual Activity   Alcohol use: No   Drug use: No    Comment: hx cocaine, marijuana    Sexual activity: Not on file  Other Topics Concern   Not on file  Social History Narrative   Lives alone. Sister lives with her and the patient  takes care of her sister.    Caffeine: stopped abruptly about 3 weeks ago    Right handed   Social Determinants of Health   Financial Resource Strain: Not on file  Food Insecurity: Not on file  Transportation Needs: Not on file  Physical Activity: Not on file  Stress: Not on file  Social Connections: Not on file  Intimate Partner Violence: Not on file      PHYSICAL EXAM  Vitals:   02/16/22 1003  BP: 129/79  Pulse: 77  Weight: 192 lb 9.6 oz (87.4 kg)  Height: 5\' 5"  (1.651 m)    Body mass index is 32.05 kg/m.     02/16/2022   10:06 AM 08/12/2021    9:55 AM 05/06/2020    9:23 AM  MMSE - Mini Mental State Exam  Orientation to time 5 5 4   Orientation to Place 4 4 4   Registration 3 3 3   Attention/ Calculation 4 3 5   Recall 2 3 3   Language- name 2 objects 2 2 2   Language- repeat 1 1 1   Language- follow 3 step command 3 3 3   Language- read & follow direction 1 1 1   Write a sentence 1 1 1   Copy design 1 1 1   Total score 27 27 28      Generalized: Well developed, in no acute distress   Neurological examination  Mentation: Alert. Follows all commands speech and language fluent Cranial nerve II-XII: Pupils were equal round reactive to light. Extraocular movements were full, visual field were full on confrontational test. Facial sensation and strength were normal. Uvula tongue midline. Head turning and shoulder shrug  were normal and symmetric. Motor: The motor testing reveals 5 over 5 strength of all 4 extremities. Good symmetric motor tone is noted throughout.  Sensory: Sensory testing is intact to soft touch on all 4 extremities. No  evidence of extinction is noted.  Coordination: Cerebellar testing reveals good finger-nose-finger and heel-to-shin bilaterally.  Gait and station: Gait is normal.    DIAGNOSTIC DATA (LABS, IMAGING, TESTING) - I reviewed patient records, labs, notes, testing and imaging myself where available.  Lab Results  Component Value Date   WBC 8.5 05/14/2020   HGB 13.4 05/14/2020   HCT 38.7 05/14/2020   MCV 109 (H) 05/14/2020   PLT 305 05/14/2020      Component Value Date/Time   NA 134 05/19/2020 1010   K 4.8 05/19/2020 1010   CL 96 05/19/2020 1010   CO2 21 05/19/2020 1010   GLUCOSE 139 (H) 05/19/2020 1010   BUN 12 05/19/2020 1010   CREATININE 0.77 05/19/2020 1010   CALCIUM 11.6 (H) 05/19/2020 1010   PROT 6.7 05/14/2020 1414   ALBUMIN 4.6 05/14/2020 1414   AST 21 05/14/2020 1414   ALT 23 05/14/2020 1414   ALKPHOS 75 05/14/2020 1414   BILITOT 0.2 05/14/2020 1414   GFRNONAA 83 05/19/2020 1010   GFRAA 96 05/19/2020 1010   Lab Results  Component Value Date   VITAMINB12 1,037 05/19/2020   Lab Results  Component Value Date   TSH 2.680 05/14/2020      ASSESSMENT AND PLAN 66 y.o. year old female  has a past medical history of Anemia, Asthma, Bipolar 1 disorder (HCC), Constipation, COPD (chronic obstructive pulmonary disease) (HCC), Dementia (HCC), Diabetes (HCC), DJD (degenerative joint disease), GERD (gastroesophageal reflux disease), Headache, new daily persistent (NDPH) (04/2020), Infection (2020), Insomnia, Overdose (08/2018), and Transient cerebral ischemia. here with:  1.  Obstructive sleep apnea  Discontinue CPAP  therapy- patient refuses to use Not interested in alternative treatments Reviewed risks associated with untreated OSA  2.  Migraine headaches  Stable, currently not on any medication  3.  Memory disturbance  Continue Aricept 10 mg at bedtime MMSE is stable Continue to monitor  Follow-up in 6 months or sooner if needed     Butch Penny, MSN, NP-C  02/16/2022, 9:58 AM [facility] [address] [place], [place] [address] [phone]

## 2022-02-17 NOTE — Progress Notes (Signed)
Ross Ludwig, RN; Dimas Millin got it      Previous Messages    ----- Message -----  From: Guy Begin, RN  Sent: 02/16/2022   4:26 PM EDT  To: Dimas Millin; Jari Favre   New orders in epic for pt.   [place] A. Birchard  Female, 67 y.o., May 06, 1956  MRN:  [id number]  Thank you  Andrey Campanile RN

## 2022-03-03 ENCOUNTER — Other Ambulatory Visit: Payer: Self-pay | Admitting: Adult Health

## 2022-03-08 ENCOUNTER — Other Ambulatory Visit: Payer: Self-pay | Admitting: Adult Health

## 2022-08-31 ENCOUNTER — Ambulatory Visit (INDEPENDENT_AMBULATORY_CARE_PROVIDER_SITE_OTHER): Payer: 59 | Admitting: Adult Health

## 2022-08-31 ENCOUNTER — Encounter: Payer: Self-pay | Admitting: Adult Health

## 2022-08-31 VITALS — BP 123/82 | HR 73 | Ht 65.0 in | Wt 185.6 lb

## 2022-08-31 DIAGNOSIS — R251 Tremor, unspecified: Secondary | ICD-10-CM

## 2022-08-31 DIAGNOSIS — G4733 Obstructive sleep apnea (adult) (pediatric): Secondary | ICD-10-CM | POA: Diagnosis not present

## 2022-08-31 MED ORDER — MEMANTINE HCL 5 MG PO TABS: ORAL_TABLET | ORAL | 11 refills | Status: DC

## 2022-08-31 NOTE — Patient Instructions (Signed)
Your Plan:  Continue Aricept Start Namenda 5 mg at bedtime for 1 week, then increase to 1 tablet twice a day. If your symptoms worsen or you develop new symptoms please let us know.   Thank you for coming to see [place] at [facility]. I hope we have been able to provide you high quality care today.  You may receive a patient satisfaction survey over the next few weeks. We would appreciate your feedback and comments so that we may continue to improve ourselves and the health of our patients.

## 2022-08-31 NOTE — Progress Notes (Signed)
PATIENT: [place] A Vernet DOB: [birth year]  REASON FOR VISIT: follow up HISTORY FROM: patient PRIMARY NEUROLOGIST: AHern  Sleep Neurologist: Dr. Vickey Huger   Chief Complaint  Patient presents with   Follow-up    Pt in 4 with daughter  Pt here for Dementia f/u Daughter states pt no longer working. Pt states tremors in both hands increased tremors in left hand. Pt and daughter states no changes on memory since last visit     HISTORY OF PRESENT ILLNESS: Today 08/31/22: Ms. Boulay is a 67 year old female with a history of OSA, memory disturbance and headches. Continues to live with her sister. Is her caregiver. Able to complete all ADLS independently. Daughter helps manage medications, appointments and finaces.  Patient still drives.  Daughter states that she has not noticed any discrepancies.  Patient feels that tremor is worse. Notices it both at rest and with activity.  She states that she does not notice the tremor daily.  Maybe 3 to 4 days a week.  No recent changes in medication.  HISTORY 02/16/22  Memory: Daughter feels that memory is slightly worse. Patient feels that memory is the same. Lives with her sister. Still taking care of older sister. Able to complete all ADLs independently. 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 Sometimes patient makes appt but doesn't tell her daughter. No change in mood or behavior. Daughter feels that she struggles with common sense scenarios. Repots that she is reactive instead of rational. Daughter reports that she has trouble reasoning with patient. Sleep ok. Still taking on Aricept 10 mg. Followed by psychiatry- daughter reports that want to put her on lithium   Migraines: no longer having any headaches  OSA: no longer using CPAP. Does not plan to restart  08/12/21: Ms. Hoggard is a 66 year old female with a history of obstructive sleep apnea on CPAP, migraine headaches and memory disturbance.  She returns today for  follow-up.  She is here with her daughter.  The daughter is concerned that she should not be living by herself and taking care of her elderly sister.  The patient lives in an apartment with her sister.  She does complete all ADLs independently.  Daughter states that she forgets to take her medication.  Her medications are in a pill pack.  She states that she is also canceling her appointments.  Remains on Aricept 10 mg at bedtime  Patient states that her headaches have been under good control.  She is no longer on Emgality or Nurtec.  States that she has had trouble using the CPAP.  Reports that she has trouble falling asleep with it on.  She does have the nasal pillows.    REVIEW OF SYSTEMS: Out of a complete 14 system review of symptoms, the patient complains only of the following symptoms, and all other reviewed systems are negative.  ALLERGIES: Allergies  Allergen Reactions   Penicillins Hives    HOME MEDICATIONS: Outpatient Medications Prior to Visit  Medication Sig Dispense Refill   albuterol (PROVENTIL HFA;VENTOLIN HFA) 108 (90 Base) MCG/ACT inhaler Inhale 2 puffs into the lungs every 4 (four) hours as needed for wheezing or shortness of breath. 1 Inhaler 2   aspirin EC 81 MG tablet Take 81 mg by mouth daily. Swallow whole.     atorvastatin (LIPITOR) 10 MG tablet Take 1 tablet by mouth daily.  1   calcium carbonate (OSCAL) 1500 (600 Ca) MG TABS tablet Take 1,500 mg by mouth daily.  cyanocobalamin 500 MCG tablet Take 500 mcg by mouth 2 (two) times daily.      donepezil (ARICEPT) 10 MG tablet TAKE 1 TABLET BY MOUTH AT BEDTIME 30 tablet 5   famotidine (PEPCID) 20 MG tablet One at bedtime 30 tablet 2   lamoTRIgine (LAMICTAL) 100 MG tablet Take 150 mg by mouth daily.      Melatonin 3 MG TABS Take 3 mg by mouth at bedtime.     mirtazapine (REMERON) 7.5 MG tablet Take 15 mg by mouth at bedtime.     montelukast (SINGULAIR) 10 MG tablet Take 10 mg by mouth daily.     Multiple Vitamin  (MULTIVITAMIN) capsule Take 1 capsule by mouth daily.     OLANZapine (ZYPREXA) 20 MG tablet Take 20 mg by mouth 2 (two) times daily.     pantoprazole (PROTONIX) 40 MG tablet Take 1 tablet (40 mg total) by mouth daily. Take 30-60 min before first meal of the day 30 tablet 2   acetaminophen (TYLENOL) 500 MG tablet Take 500-1,500 mg by mouth 3 (three) times daily as needed.      No facility-administered medications prior to visit.    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 unsure if intentional.   Transient cerebral ischemia     PAST SURGICAL HISTORY: Past Surgical History:  Procedure Laterality Date   ABDOMINAL HYSTERECTOMY  1999   HIP SURGERY     SI joint surgery 06-2021 (3 titanium rods)   WISDOM TOOTH EXTRACTION      FAMILY HISTORY: Family History  Problem Relation Age of Onset   Lymphoma Mother    Hypertension Mother    Stroke Mother    Dementia Father    Hypertension Sister    Diabetes Sister    Sleep apnea Maternal Aunt    Diabetes Daughter     SOCIAL HISTORY: Social History   Socioeconomic History   Marital status: Divorced    Spouse name: Not on file   Number of children: 1   Years of education: 10   Highest education level: Not on file  Occupational History    Comment: [employer]  Tobacco Use   Smoking status: Former    Packs/day: 2.00    Years: 46.00    Total pack years: 92.00    Types: Cigarettes    Quit date: 04/04/2016    Years since quitting: 6.4   Smokeless tobacco: Never  Vaping Use   Vaping Use: Never used  Substance and Sexual  Activity   Alcohol use: No   Drug use: No    Comment: hx cocaine, marijuana    Sexual activity: Not on file  Other Topics Concern   Not on file  Social History Narrative   Lives alone. Sister lives with her and the patient takes care of her sister.    Caffeine: stopped abruptly about 3 weeks ago    Right handed   Social Determinants of Health   Financial Resource Strain: Not on file  Food Insecurity:  Not on file  Transportation Needs: Not on file  Physical Activity: Not on file  Stress: Not on file  Social Connections: Not on file  Intimate Partner Violence: Not on file      PHYSICAL EXAM  Vitals:   08/31/22 1128  BP: 123/82  Pulse: 73  Weight: 185 lb 9.6 oz (84.2 kg)  Height: 5\' 5"  (1.651 m)    Body mass index is 30.89 kg/m.     08/31/2022   11:30 AM 02/16/2022   10:06 AM 08/12/2021    9:55 AM  MMSE - Mini Mental State Exam  Orientation to time 5 5 5   Orientation to Place 4 4 4   Registration 3 3 3   Attention/ Calculation 1 4 3   Recall 1 2 3   Language- name 2 objects 2 2 2   Language- repeat 1 1 1   Language- follow 3 step command 3 3 3   Language- read & follow direction 1 1 1   Write a sentence 1 1 1   Copy design 1 1 1   Total score 23 27 27      Generalized: Well developed, in no acute distress   Neurological examination  Mentation: Alert. Follows all commands speech and language fluent Cranial nerve II-XII: Pupils were equal round reactive to light. Extraocular movements were full, visual field were full on confrontational test. Facial sensation and strength were normal. Uvula tongue midline. Head turning and shoulder shrug  were normal and symmetric. Motor: The motor testing reveals 5 over 5 strength of all 4 extremities. Good symmetric motor tone is noted throughout.  Sensory: Sensory testing is intact to soft touch on all 4 extremities. No evidence of extinction is noted.  Coordination: Cerebellar testing reveals good finger-nose-finger and  heel-to-shin bilaterally.  Gait and station: Gait is normal.    DIAGNOSTIC DATA (LABS, IMAGING, TESTING) - I reviewed patient records, labs, notes, testing and imaging myself where available.  Lab Results  Component Value Date   WBC 8.5 05/14/2020   HGB 13.4 05/14/2020   HCT 38.7 05/14/2020   MCV 109 (H) 05/14/2020   PLT 305 05/14/2020      Component Value Date/Time   NA 134 05/19/2020 1010   K 4.8 05/19/2020 1010   CL 96 05/19/2020 1010   CO2 21 05/19/2020 1010   GLUCOSE 139 (H) 05/19/2020 1010   BUN 12 05/19/2020 1010   CREATININE 0.77 05/19/2020 1010   CALCIUM 11.6 (H) 05/19/2020 1010   PROT 6.7 05/14/2020 1414   ALBUMIN 4.6 05/14/2020 1414   AST 21 05/14/2020 1414   ALT 23 05/14/2020 1414   ALKPHOS 75 05/14/2020 1414   BILITOT 0.2 05/14/2020 1414   GFRNONAA 83 05/19/2020 1010   GFRAA 96 05/19/2020 1010   Lab Results  Component Value Date   VITAMINB12 1,037 05/19/2020   Lab Results  Component Value Date   TSH 2.680 05/14/2020      ASSESSMENT AND PLAN 67 y.o. year old female  has a past medical history of Anemia, Asthma, Bipolar 1 disorder (HCC), Constipation, COPD (chronic obstructive pulmonary disease) (HCC), Dementia (HCC), Diabetes (HCC), DJD (degenerative joint disease), GERD (gastroesophageal reflux disease), Headache, new daily persistent (NDPH) (04/2020), Infection (2020), Insomnia, Overdose (08/2018), and Transient cerebral ischemia. here with:    1.  Memory disturbance  MMSE 23/30- declined Continue Aricept 10 mg at bedtime Start Namenda 5 mg BID   2. Tremor  Monitor for now  Discussed possible medication options but will just monitor symptoms for now.   Follow-up in 6  months or sooner if needed     Ward Givens, MSN, NP-C 08/31/2022, 11:40 AM [facility] [address], [place], [place] [address] [phone]

## 2022-10-16 ENCOUNTER — Other Ambulatory Visit: Payer: Self-pay | Admitting: Adult Health

## 2023-03-05 IMAGING — CT CT BIOPSY
1 of 6 series · 9 of 32 positions shown, 15 images · non-contrast
Comparison: none

CLINICAL DATA: Suspected left sacroiliac joint dysfunction. History
of prior injections without relief.

[Series 2: needle -guided injection · axial · 0.80mm/px · z∈[-113,-31]mm · 9 of 53 slices shown, 15 images]
[im 6/53  soft-tissue]
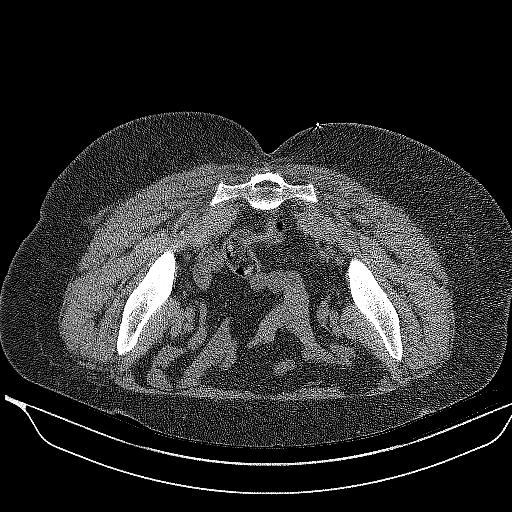
[im 6/53  bone]
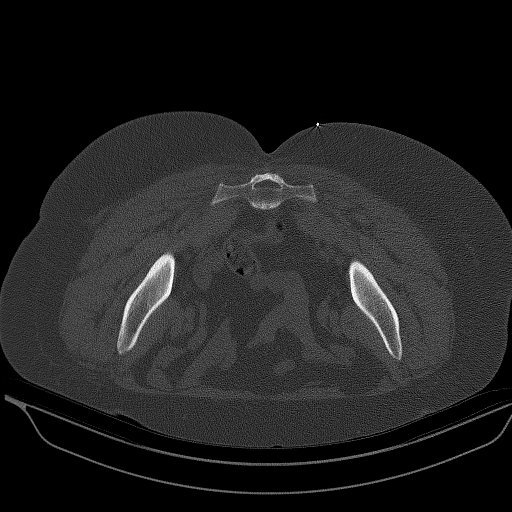
[im 11/53  soft-tissue]
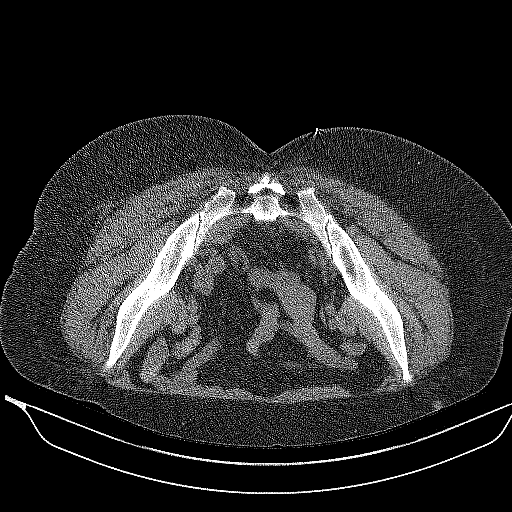
[im 16/53  soft-tissue]
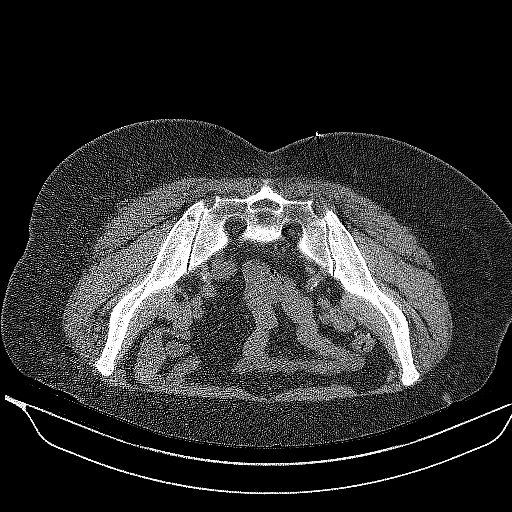
[im 21/53  soft-tissue]
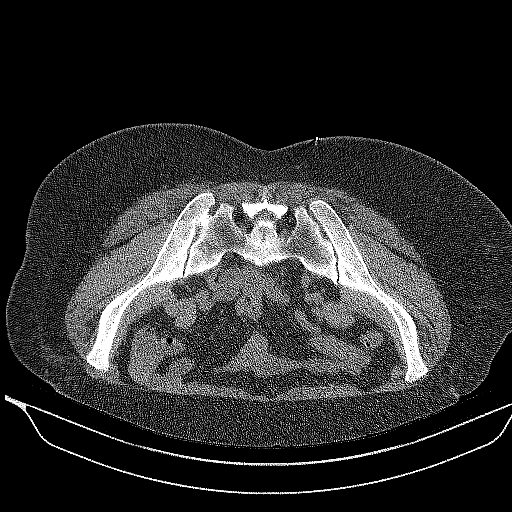
[im 27/53  soft-tissue]
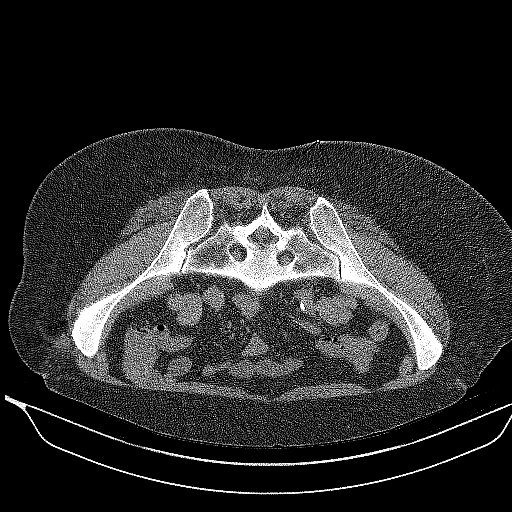
[im 32/53  soft-tissue]
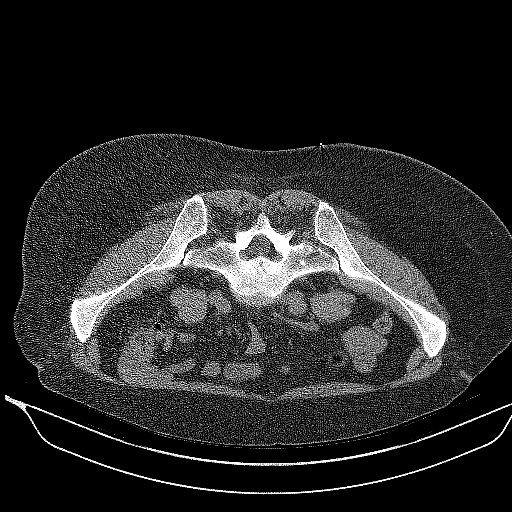
[im 32/53  lung]
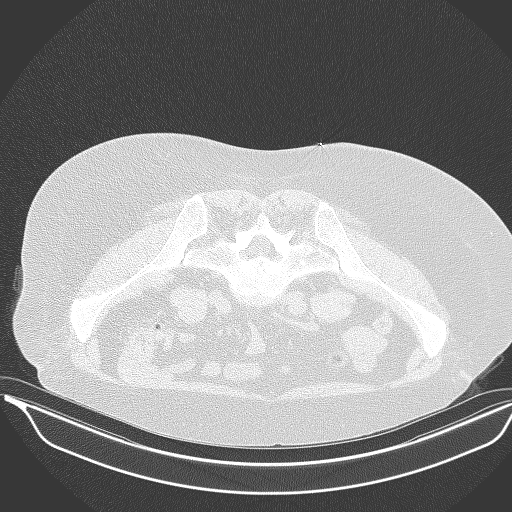
[im 37/53  soft-tissue]
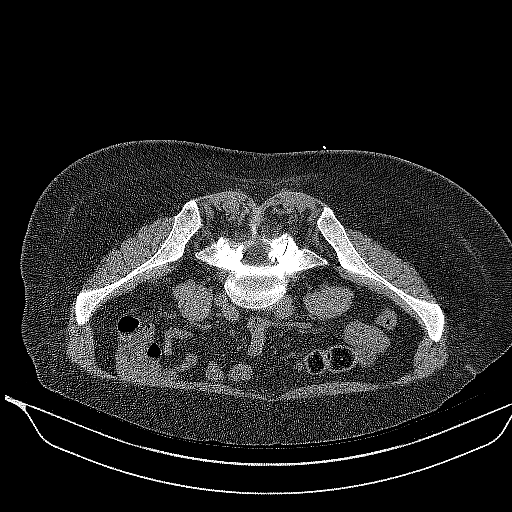
[im 37/53  lung]
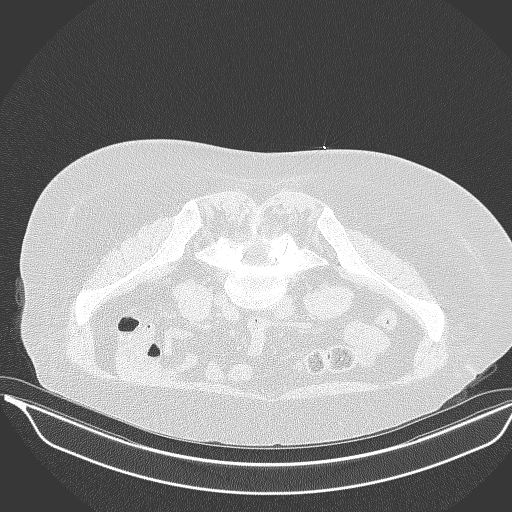
[im 42/53  soft-tissue]
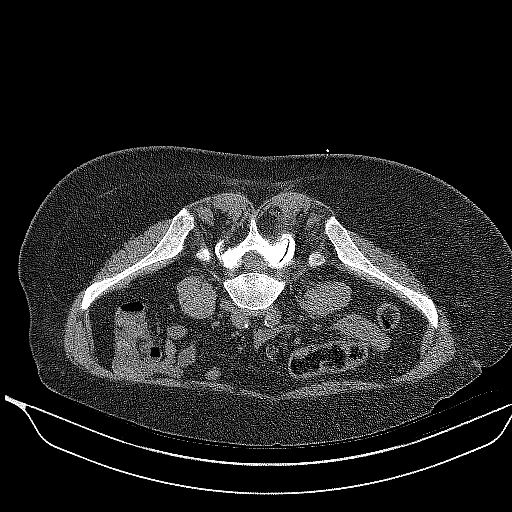
[im 42/53  lung]
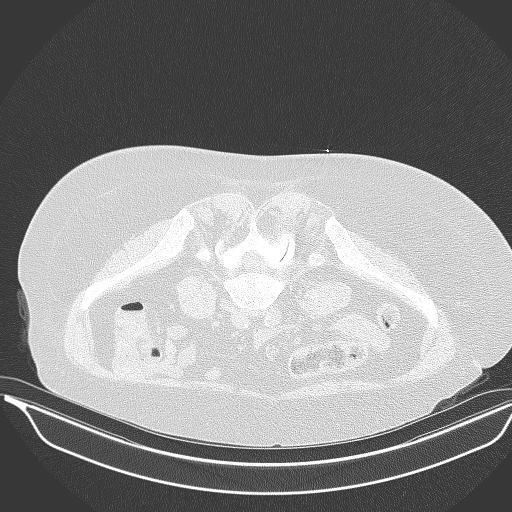
[im 47/53  soft-tissue]
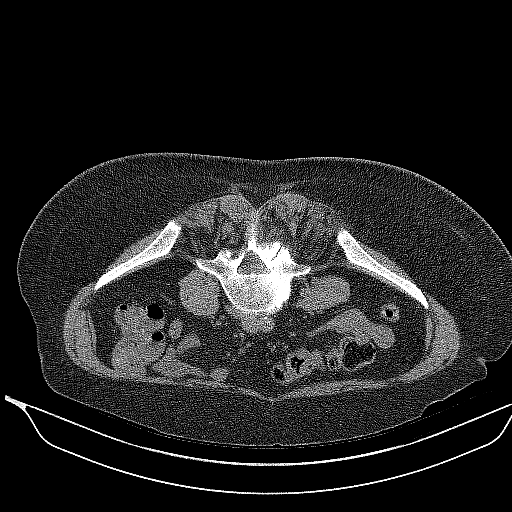
[im 47/53  lung]
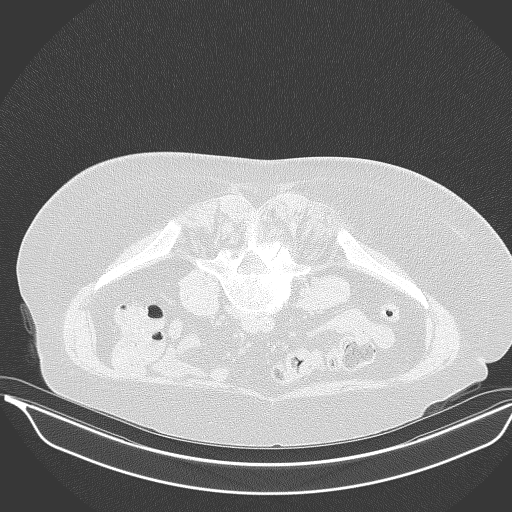
[im 47/53  bone]
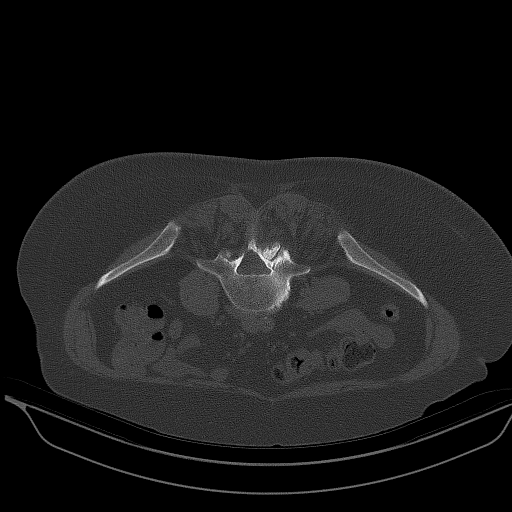

[9 of 32 positions shown; findings below may reference images not displayed]

EXAM:
CT-GUIDED LEFT SACROILIAC JOINT INJECTION

PROCEDURE:
After a thorough discussion of risks and benefits of the procedure,
including bleeding, infection, injury to nerves, blood vessels, and
adjacent structures, verbal and written consent was obtained. The
patient was placed prone on the CT table and localization was
performed over the sacrum. Target site was marked using CT guidance.
The skin was prepped and draped in the usual sterile fashion using
Betadine soap.

After local anesthesia with 1% lidocaine without epinephrine and
subsequent deep anesthesia, a 3 inch 22 gauge spinal needle was
advanced into the left SI joint. Injection of 0.5 ml Isovue-M 200
confirmed intra-articular placement. No vascular uptake present.
Subsequently, 1.5 mL of 0.25% bupivacaine were injected into the
left SI joint. Needle was removed and a sterile dressing applied.

No complications were observed. The patient was observed and
released under the care of a driver after 30 minutes.
IMPRESSION: Successful CT guided left SI joint injection with anesthetic only.

## 2023-03-16 ENCOUNTER — Ambulatory Visit: Payer: Medicaid Other | Admitting: Adult Health

## 2023-03-28 ENCOUNTER — Encounter: Payer: Self-pay | Admitting: Adult Health

## 2023-03-28 ENCOUNTER — Ambulatory Visit (INDEPENDENT_AMBULATORY_CARE_PROVIDER_SITE_OTHER): Payer: Medicare Other | Admitting: Adult Health

## 2023-03-28 VITALS — BP 124/81 | HR 73 | Ht 65.0 in | Wt 177.0 lb

## 2023-03-28 DIAGNOSIS — R251 Tremor, unspecified: Secondary | ICD-10-CM

## 2023-03-28 DIAGNOSIS — R413 Other amnesia: Secondary | ICD-10-CM

## 2023-03-28 NOTE — Patient Instructions (Signed)
Your Plan:  Continue Aricept and namenda Memory score is stable mini-mental status exam      Thank you for coming to see [place] at [facility]. I hope we have been able to provide you high quality care today.  You may receive a patient satisfaction survey over the next few weeks. We would appreciate your feedback and comments so that we may continue to improve ourselves and the health of our patients.

## 2023-03-28 NOTE — Progress Notes (Signed)
PATIENT: [place] A Dascenzo DOB: 1956/07/08  REASON FOR VISIT: follow up HISTORY FROM: patient PRIMARY NEUROLOGIST: AHern  Sleep Neurologist: Dr. Vickey Huger   Chief Complaint  Patient presents with   Memory Loss    Rm 9 alone Pt is well, reports she still does not use CPAP due to intolerance.  Her memory has also worsen since last visit. It is hard for her to retain information.     HISTORY OF PRESENT ILLNESS: Today 03/28/23:  [place] A Sachse is a 67 y.o. female with a history of tremor and memory disturbance. Returns today for follow-up. She is here alone today.  Memory: some trouble with short term memory. Continues to live with sister. Able to complete all ADLs independently. Manages medications, appointments, and finances. Still driving- no issues. Goes to game night with her sister. Remains and Aricept and namenda   Tremor: Feels that tremor is the same. No worse    08/31/22: Ms. Haughney is a 67 year old female with a history of OSA, memory disturbance and headches. Continues to live with her sister. Is her caregiver. Able to complete all ADLS independently. Daughter helps manage medications, appointments and finaces.  Patient still drives.  Daughter states that she has not noticed any discrepancies.  Patient feels that tremor is worse. Notices it both at rest and with activity.  She states that she does not notice the tremor daily.  Maybe 3 to 4 days a week.  No recent changes in medication.  HISTORY 02/16/22  Memory: Daughter feels that memory is slightly worse. Patient feels that memory is the same. Lives with her sister. Still taking care of older sister. Able to complete all ADLs independently. 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 Sometimes patient makes appt but doesn't tell her daughter. No change in mood or behavior. Daughter feels that she struggles with common sense scenarios. Repots that she is reactive instead of rational. Daughter  reports that she has trouble reasoning with patient. Sleep ok. Still taking on Aricept 10 mg. Followed by psychiatry- daughter reports that want to put her on lithium   Migraines: no longer having any headaches  OSA: no longer using CPAP. Does not plan to restart  08/12/21: Ms. Lowney is a 67 year old female with a history of obstructive sleep apnea on CPAP, migraine headaches and memory disturbance.  She returns today for follow-up.  She is here with her daughter.  The daughter is concerned that she should not be living by herself and taking care of her elderly sister.  The patient lives in an apartment with her sister.  She does complete all ADLs independently.  Daughter states that she forgets to take her medication.  Her medications are in a pill pack.  She states that she is also canceling her appointments.  Remains on Aricept 10 mg at bedtime  Patient states that her headaches have been under good control.  She is no longer on Emgality or Nurtec.  States that she has had trouble using the CPAP.  Reports that she has trouble falling asleep with it on.  She does have the nasal pillows.    REVIEW OF SYSTEMS: Out of a complete 14 system review of symptoms, the patient complains only of the following symptoms, and all other reviewed systems are negative.  ALLERGIES: Allergies  Allergen Reactions   Penicillins Hives    HOME MEDICATIONS: Outpatient Medications Prior to Visit  Medication Sig Dispense Refill   acetaminophen (TYLENOL) 500  MG tablet Take 500-1,500 mg by mouth 3 (three) times daily as needed.      albuterol (PROVENTIL HFA;VENTOLIN HFA) 108 (90 Base) MCG/ACT inhaler Inhale 2 puffs into the lungs every 4 (four) hours as needed for wheezing or shortness of breath. 1 Inhaler 2   aspirin EC 81 MG tablet Take 81 mg by mouth daily. Swallow whole.     atorvastatin (LIPITOR) 10 MG tablet Take 1 tablet by mouth daily.  1   calcium carbonate (OSCAL) 1500 (600 Ca) MG TABS tablet Take  1,500 mg by mouth daily.     cyanocobalamin 500 MCG tablet Take 500 mcg by mouth 2 (two) times daily.      donepezil (ARICEPT) 10 MG tablet TAKE 1 TABLET BY MOUTH AT BEDTIME 30 tablet 5   famotidine (PEPCID) 20 MG tablet One at bedtime 30 tablet 2   lamoTRIgine (LAMICTAL) 100 MG tablet Take 200 mg by mouth daily.     Melatonin 3 MG TABS Take 3 mg by mouth at bedtime.     memantine (NAMENDA) 5 MG tablet Take one tablet at bedtime for 1 week then increase to 1 tablet PO BID. 60 tablet 11   mirtazapine (REMERON) 7.5 MG tablet Take 15 mg by mouth at bedtime.     montelukast (SINGULAIR) 10 MG tablet Take 10 mg by mouth daily.     Multiple Vitamin (MULTIVITAMIN) capsule Take 1 capsule by mouth daily.     OLANZapine (ZYPREXA) 20 MG tablet Take 20 mg by mouth 2 (two) times daily.     pantoprazole (PROTONIX) 40 MG tablet Take 1 tablet (40 mg total) by mouth daily. Take 30-60 min before first meal of the day 30 tablet 2   No facility-administered medications prior to visit.    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 unsure if intentional.   Transient cerebral ischemia     PAST SURGICAL HISTORY: Past Surgical History:  Procedure Laterality Date   ABDOMINAL HYSTERECTOMY  1999   HIP SURGERY     SI joint surgery 06-2021 (3 titanium rods)   WISDOM TOOTH EXTRACTION      FAMILY HISTORY: Family History  Problem Relation Age of Onset   Lymphoma Mother    Hypertension Mother    Stroke Mother    Dementia Father    Hypertension Sister    Diabetes Sister    Sleep apnea Maternal Aunt     Diabetes Daughter     SOCIAL HISTORY: Social History   Socioeconomic History   Marital status: Divorced    Spouse name: Not on file   Number of children: 1   Years of education: 10   Highest education level: Not on file  Occupational History    Comment: [employer]  Tobacco Use   Smoking status: Former    Current packs/day: 0.00    Average packs/day: 2.0 packs/day for 46.0 years (92.0 ttl pk-yrs)    Types: Cigarettes    Start date: 04/04/1970    Quit date: 04/04/2016  Years since quitting: 6.9   Smokeless tobacco: Never  Vaping Use   Vaping status: Never Used  Substance and Sexual Activity   Alcohol use: No   Drug use: No    Comment: hx cocaine, marijuana    Sexual activity: Not on file  Other Topics Concern   Not on file  Social History Narrative   Lives alone. Sister lives with her and the patient takes care of her sister.    Caffeine: stopped abruptly about 3 weeks ago    Right handed   Social Determinants of Health   Financial Resource Strain: Not on file  Food Insecurity: Not on file  Transportation Needs: Not on file  Physical Activity: Not on file  Stress: Not on file  Social Connections: Unknown (01/03/2022)   Received from [facility]   Social Network    Social Network: Not on file  Intimate Partner Violence: Unknown (11/25/2021)   Received from [facility]   HITS    Physically Hurt: Not on file    Insult or Talk Down To: Not on file    Threaten Physical Harm: Not on file    Scream or Curse: Not on file      PHYSICAL EXAM  Vitals:   03/28/23 1049  BP: 124/81  Pulse: 73  Weight: 177 lb (80.3 kg)  Height: 5\' 5"  (1.651 m)    Body mass index is 29.45 kg/m.     03/28/2023   10:49 AM 08/31/2022   11:30 AM 02/16/2022   10:06 AM  MMSE - Mini Mental State Exam  Orientation to time 4 5 5   Orientation to Place 3 4 4   Registration 3 3 3   Attention/ Calculation 4 1 4   Recall 2 1 2   Language- name 2 objects 2  2 2   Language- repeat 1 1 1   Language- follow 3 step command 3 3 3   Language- read & follow direction 1 1 1   Write a sentence 1 1 1   Copy design 1 1 1   Total score 25 23 27      Generalized: Well developed, in no acute distress   Neurological examination  Mentation: Alert. Follows all commands speech and language fluent Cranial nerve II-XII: Pupils were equal round reactive to light. Extraocular movements were full, visual field were full on confrontational test. Facial sensation and strength were normal. Uvula tongue midline. Head turning and shoulder shrug  were normal and symmetric. Motor: The motor testing reveals 5 over 5 strength of all 4 extremities. Good symmetric motor tone is noted throughout.  Sensory: Sensory testing is intact to soft touch on all 4 extremities. No evidence of extinction is noted.  Coordination: Cerebellar testing reveals good finger-nose-finger and heel-to-shin bilaterally.  Gait and station: Gait is normal.    DIAGNOSTIC DATA (LABS, IMAGING, TESTING) - I reviewed patient records, labs, notes, testing and imaging myself where available.  Lab Results  Component Value Date   WBC 8.5 05/14/2020   HGB 13.4 05/14/2020   HCT 38.7 05/14/2020   MCV 109 (H) 05/14/2020   PLT 305 05/14/2020      Component Value Date/Time   NA 134 05/19/2020 1010   K 4.8 05/19/2020 1010   CL 96 05/19/2020 1010   CO2 21 05/19/2020 1010   GLUCOSE 139 (H) 05/19/2020 1010   BUN 12 05/19/2020 1010   CREATININE 0.77 05/19/2020 1010   CALCIUM 11.6 (H) 05/19/2020 1010   PROT 6.7 05/14/2020 1414   ALBUMIN 4.6 05/14/2020  1414   AST 21 05/14/2020 1414   ALT 23 05/14/2020 1414   ALKPHOS 75 05/14/2020 1414   BILITOT 0.2 05/14/2020 1414   GFRNONAA 83 05/19/2020 1010   GFRAA 96 05/19/2020 1010   Lab Results  Component Value Date   VITAMINB12 1,037 05/19/2020   Lab Results  Component Value Date   TSH 2.680 05/14/2020      ASSESSMENT AND PLAN 67 y.o. year old female  has  a past medical history of Anemia, Asthma, Bipolar 1 disorder (HCC), Constipation, COPD (chronic obstructive pulmonary disease) (HCC), Dementia (HCC), Diabetes (HCC), DJD (degenerative joint disease), GERD (gastroesophageal reflux disease), Headache, new daily persistent (NDPH) (04/2020), Infection (2020), Insomnia, Overdose (08/2018), and Transient cerebral ischemia. here with:    1.  Memory disturbance  MMSE 25/30 previously 23/30 Continue Aricept 10 mg at bedtime Start Namenda 5 mg BID   2. Tremor  Monitor for now  stable   Follow-up in 6 months or sooner if needed     Butch Penny, MSN, NP-C 03/28/2023, 11:12 AM [facility] [address], [address] [place], [place] [address] [phone]

## 2023-04-12 ENCOUNTER — Other Ambulatory Visit: Payer: Self-pay | Admitting: Adult Health

## 2023-04-17 NOTE — Telephone Encounter (Signed)
Rx refilled per last office visit note "continue on Aricept and Namenda."

## 2023-05-15 ENCOUNTER — Other Ambulatory Visit: Payer: Self-pay | Admitting: Adult Health

## 2023-07-06 ENCOUNTER — Telehealth: Payer: Self-pay | Admitting: Adult Health

## 2023-07-06 NOTE — Telephone Encounter (Signed)
Pt's daughter has called stating pt has been to her PCP re: worsening tremors, they were advised to f/u with Aundra Millet, NP re: the tremors becoming more uncontrollable.  Pt's daughter accepted Megan's next available appointment with wait list. This is FYI to POD 4

## 2023-07-24 ENCOUNTER — Encounter: Payer: Self-pay | Admitting: Adult Health

## 2023-07-24 ENCOUNTER — Ambulatory Visit (INDEPENDENT_AMBULATORY_CARE_PROVIDER_SITE_OTHER): Payer: Medicare Other | Admitting: Adult Health

## 2023-07-24 VITALS — BP 135/83 | HR 80 | Ht 63.5 in | Wt 170.0 lb

## 2023-07-24 DIAGNOSIS — R251 Tremor, unspecified: Secondary | ICD-10-CM | POA: Diagnosis not present

## 2023-07-24 DIAGNOSIS — H532 Diplopia: Secondary | ICD-10-CM

## 2023-07-24 NOTE — Progress Notes (Signed)
PATIENT: Kimberly Ellison DOB: 06-15-1956  REASON FOR VISIT: follow up HISTORY FROM: patient PRIMARY NEUROLOGIST: [facility]  Sleep Neurologist: Dr. Vickey Huger   Chief Complaint  Patient presents with   RM 4    Patient is here with her daughter for worsening tremors. Both arms are worse since the last appointment. Her left arm is worse than her right arm. She has to hold her L arm against her or sit on it because it gets so bad. She hasn't been sleeping well and tremors were bad at night. She is on Mirtazapine 15 mg and is sleeping better. She saw pcp. They recommended she see neurology. She still does not use cpap. ESS 11, FSS 9.    HISTORY OF PRESENT ILLNESS: Today 07/24/23:  Kimberly Ellison is a 67 y.o. female with a history of tremor and memory disturbance. Returns today for follow-up.  She is here today with her daughter.  She states that 2 weeks ago her tremors worsen.  States that her tremors were keeping her up at night.  States that she will try to lay on her hands in order to keep her hands from tremoring.  She states that they have improved.  Her PCP increased her mirtazapine so she is sleeping better.  She still notices the tremor during the day.  She notices that when she is eating but daughter reports that it also occurs randomly at rest.  She states that she will be fine and then all of a sudden her hands will start shaking.  Left side tends to be worse than right.  Her daughter does note that she has been on an antipsychotic medication since she has been in her 30s.  She has been on Zyprexa for at least 10 years.  Patient also reports that when the tremors worsen 2 weeks ago she also noted double vision.  She only sees a double vision when she looks to the left.  She states that is not constant but is there more than it is not.  Nothing seems to make the double vision worse.  She denies ptosis.  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  She has  never been able to have full horizontal gaze to the left in the left eye.  Denies trouble speaking or swallowing.  Denies weakness in the arms of the legs.    03/28/23: Kimberly Ellison is a 67 y.o. female with a history of tremor and memory disturbance. Returns today for follow-up. She is here alone today.  Memory: some trouble with short term memory. Continues to live with sister. Able to complete all ADLs independently. Manages medications, appointments, and finances. Still driving- no issues. Goes to game night with her sister. Remains and Aricept and namenda   Tremor: Feels that tremor is the same. No worse    08/31/22: Kimberly Ellison is a 67 year old female with a history of OSA, memory disturbance and headches. Continues to live with her sister. Is her caregiver. Able to complete all ADLS independently. Daughter helps manage medications, appointments and finaces.  Patient still drives.  Daughter states that she has not noticed any discrepancies.  Patient feels that tremor is worse. Notices it both at rest and with activity.  She states that she does not notice the tremor daily.  Maybe 3 to 4 days a week.  No recent changes in medication.  HISTORY 02/16/22  Memory: Daughter feels  that memory is slightly worse. Patient feels that memory is the same. Lives with her sister. Still taking care of older sister. Able to complete all ADLs independently. 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 Sometimes patient makes appt but doesn't tell her daughter. No change in mood or behavior. Daughter feels that she struggles with common sense scenarios. Repots that she is reactive instead of rational. Daughter reports that she has trouble reasoning with patient. Sleep ok. Still taking on Aricept 10 mg. Followed by psychiatry- daughter reports that want to put her on lithium   Migraines: no longer having any headaches  OSA: no longer using CPAP. Does not plan to restart  08/12/21:  Kimberly Ellison is a 67 year old female with a history of obstructive sleep apnea on CPAP, migraine headaches and memory disturbance.  She returns today for follow-up.  She is here with her daughter.  The daughter is concerned that she should not be living by herself and taking care of her elderly sister.  The patient lives in an apartment with her sister.  She does complete all ADLs independently.  Daughter states that she forgets to take her medication.  Her medications are in a pill pack.  She states that she is also canceling her appointments.  Remains on Aricept 10 mg at bedtime  Patient states that her headaches have been under good control.  She is no longer on Emgality or Nurtec.  States that she has had trouble using the CPAP.  Reports that she has trouble falling asleep with it on.  She does have the nasal pillows.    REVIEW OF SYSTEMS: Out of a complete 14 system review of symptoms, the patient complains only of the following symptoms, and all other reviewed systems are negative.  ALLERGIES: Allergies  Allergen Reactions   Penicillins Hives    HOME MEDICATIONS: Outpatient Medications Prior to Visit  Medication Sig Dispense Refill   acetaminophen (TYLENOL) 500 MG tablet Take 500-1,500 mg by mouth 3 (three) times daily as needed.      albuterol (PROVENTIL HFA;VENTOLIN HFA) 108 (90 Base) MCG/ACT inhaler Inhale 2 puffs into the lungs every 4 (four) hours as needed for wheezing or shortness of breath. 1 Inhaler 2   aspirin EC 81 MG tablet Take 81 mg by mouth daily. Swallow whole.     atorvastatin (LIPITOR) 10 MG tablet Take 1 tablet by mouth daily.  1   calcium carbonate (OSCAL) 1500 (600 Ca) MG TABS tablet Take 1,500 mg by mouth daily.     cyanocobalamin 500 MCG tablet Take 500 mcg by mouth 2 (two) times daily.      donepezil (ARICEPT) 10 MG tablet TAKE 1 TABLET BY MOUTH AT BEDTIME 30 tablet 11   famotidine (PEPCID) 20 MG tablet One at bedtime 30 tablet 2   lamoTRIgine (LAMICTAL) 100 MG  tablet Take 200 mg by mouth daily.     Melatonin 3 MG TABS Take 3 mg by mouth at bedtime.     memantine (NAMENDA) 5 MG tablet Take one tablet at bedtime for 1 week then increase to 1 tablet PO BID. 60 tablet 11   mirtazapine (REMERON) 7.5 MG tablet Take 15 mg by mouth at bedtime.     montelukast (SINGULAIR) 10 MG tablet Take 10 mg by mouth daily.     Multiple Vitamin (MULTIVITAMIN) capsule Take 1 capsule by mouth daily.     OLANZapine (ZYPREXA) 20 MG tablet Take 20 mg by mouth 2 (two) times daily.  pantoprazole (PROTONIX) 40 MG tablet Take 1 tablet (40 mg total) by mouth daily. Take 30-60 min before first meal of the day 30 tablet 2   No facility-administered medications prior to visit.    PAST MEDICAL HISTORY: Past Medical History:  Diagnosis Date   Anemia    Asthma    Bipolar 1 disorder (HCC)    Constipation    COPD (chronic obstructive pulmonary disease) (HCC)    Dementia (HCC)    Diabetes (HCC)    DJD (degenerative joint disease)    GERD (gastroesophageal reflux disease)    Headache, new daily persistent ([facility]) 04/2020   Infection 2020   stomach infection   Insomnia    Overdose 08/2018   per daughter, hospitalized at [facility]. unsure if intentional.   Transient cerebral ischemia     PAST SURGICAL HISTORY: Past Surgical History:  Procedure Laterality Date   ABDOMINAL HYSTERECTOMY  1999   HIP SURGERY     SI joint surgery 06-2021 (3 titanium rods)   WISDOM TOOTH EXTRACTION      FAMILY HISTORY: Family History  Problem Relation Age of Onset   Lymphoma Mother    Hypertension Mother    Stroke Mother    Dementia Father    Hypertension Sister    Diabetes Sister    Sleep apnea Maternal Aunt    Diabetes Daughter     SOCIAL HISTORY: Social History   Socioeconomic History   Marital status: Divorced    Spouse name: Not on file   Number of children: 1   Years of education: 10   Highest education level: Not on file  Occupational History    Comment: [employer]   Tobacco Use   Smoking status: Former    Current packs/day: 0.00    Average packs/day: 2.0 packs/day for 46.0 years (92.0 ttl pk-yrs)    Types: Cigarettes    Start date: 04/04/1970    Quit date: 04/04/2016    Years since quitting: 7.3   Smokeless tobacco: Never  Vaping Use   Vaping status: Never Used  Substance and Sexual Activity   Alcohol use: No   Drug use: No    Comment: hx cocaine, marijuana    Sexual activity: Not on file  Other Topics Concern   Not on file  Social History Narrative   Lives alone   Sister passed in September 2024   Caffeine: diet coke, 80-96 oz per day    Right handed   Social Determinants of Health   Financial Resource Strain: Not on file  Food Insecurity: Not on file  Transportation Needs: Not on file  Physical Activity: Not on file  Stress: Not on file  Social Connections: Unknown (01/03/2022)   [facility]   Social Network    Social Network: Not on file  Intimate Partner Violence: Unknown (11/25/2021)   Received from [facility], [facility]   HITS    Physically Hurt: Not on file    Insult or Talk Down To: Not on file    Threaten Physical Harm: Not on file    Scream or Curse: Not on file      PHYSICAL EXAM  Vitals:   07/24/23 1424  BP: 135/83  Pulse: 80  Weight: 170 lb (77.1 kg)  Height: 5' 3.5" (1.613 m)    Body mass index is 29.64 kg/m.     07/24/2023    3:01 PM 03/28/2023   10:49 AM 08/31/2022   11:30 AM  MMSE -  Mini Mental State Exam  Orientation to time 4 4 5   Orientation to Place 4 3 4   Registration 3 3 3   Attention/ Calculation 4 4 1   Recall 2 2 1   Language- name 2 objects 2 2 2   Language- repeat 1 1 1   Language- follow 3 step command 3 3 3   Language- read & follow direction 1 1 1   Write a sentence 1 1 1   Copy design 1 1 1   Total score 26 25 23      Generalized: Well developed, in no acute distress   Neurological examination  Mentation: Alert. Follows all commands speech and  language fluent Cranial nerve II-XII: Pupils were equal round reactive to light. Extraocular movements were full, visual field were full on confrontational test. Facial sensation and strength were normal. Uvula tongue midline. Head turning and shoulder shrug  were normal and symmetric.  Superior gaze held for 1 minute no ptosis noted.  Diplopia only noted when she is looking to the left.  Diplopia is horizontal. Motor: The motor testing reveals 5 over 5 strength of all 4 extremities. Good symmetric motor tone is noted throughout.  With arms abducted for 1 minute no weakness noted Sensory: Sensory testing is intact to soft touch on all 4 extremities. No evidence of extinction is noted.  Coordination: Cerebellar testing reveals good finger-nose-finger and heel-to-shin bilaterally.  Gait and station: Gait is normal.    DIAGNOSTIC DATA (LABS, IMAGING, TESTING) - I reviewed patient records, labs, notes, testing and imaging myself where available.  Lab Results  Component Value Date   WBC 8.5 05/14/2020   HGB 13.4 05/14/2020   HCT 38.7 05/14/2020   MCV 109 (H) 05/14/2020   PLT 305 05/14/2020      Component Value Date/Time   NA 134 05/19/2020 1010   K 4.8 05/19/2020 1010   CL 96 05/19/2020 1010   CO2 21 05/19/2020 1010   GLUCOSE 139 (H) 05/19/2020 1010   BUN 12 05/19/2020 1010   CREATININE 0.77 05/19/2020 1010   CALCIUM 11.6 (H) 05/19/2020 1010   PROT 6.7 05/14/2020 1414   ALBUMIN 4.6 05/14/2020 1414   AST 21 05/14/2020 1414   ALT 23 05/14/2020 1414   ALKPHOS 75 05/14/2020 1414   BILITOT 0.2 05/14/2020 1414   GFRNONAA 83 05/19/2020 1010   GFRAA 96 05/19/2020 1010   Lab Results  Component Value Date   VITAMINB12 1,037 05/19/2020   Lab Results  Component Value Date   TSH 2.680 05/14/2020      ASSESSMENT AND PLAN 67 y.o. year old female  has a past medical history of Anemia, Asthma, Bipolar 1 disorder (HCC), Constipation, COPD (chronic obstructive pulmonary disease) (HCC),  Dementia (HCC), Diabetes (HCC), DJD (degenerative joint disease), GERD (gastroesophageal reflux disease), Headache, new daily persistent ([facility]) (04/2020), Infection (2020), Insomnia, Overdose (08/2018), and Transient cerebral ischemia. here with:     1. Tremor  We discussed medication that she could use for possible essential tremor such as Topamax, Keppra and primidone however these all have interactions with medication she is currently on.  For now we recommend that she continue to monitor her symptoms.  She has already noted some improvement with mirtazapine-especially at night.  Could use wrist weights or weighted utensils if needed.  2.  Diplopia  MRI of the brain with and without contrast to rule out structural lesion, infarct etc. that could be the cause of double vision Pending results of MRI we will decide if any further workup is needed.  Consulted with Dr. Daisy Blossom regarding the plan of care.   Follow-up in 6 months or sooner if needed     Butch Penny, MSN, NP-C 07/24/2023, 2:33 PM [facility] [address], [address] [place], [place] [address] [phone]

## 2023-07-25 ENCOUNTER — Telehealth: Payer: Self-pay | Admitting: Adult Health

## 2023-07-25 NOTE — Telephone Encounter (Signed)
medicare/medicaid NPR sent to [facility] [phone]

## 2023-09-01 ENCOUNTER — Ambulatory Visit
Admission: RE | Admit: 2023-09-01 | Discharge: 2023-09-01 | Disposition: A | Discharge status: Home / Self Care | Payer: Medicare Other | Source: Ambulatory Visit | Attending: Adult Health

## 2023-09-01 DIAGNOSIS — H532 Diplopia: Secondary | ICD-10-CM

## 2023-09-01 MED ORDER — GADOPICLENOL 0.5 MMOL/ML IV SOLN
8.0000 mL | Freq: Once | INTRAVENOUS | Status: AC | PRN
  Administered 2023-09-01: 8 mL via INTRAVENOUS

## 2023-09-07 ENCOUNTER — Telehealth: Payer: Self-pay | Admitting: Adult Health

## 2023-09-07 NOTE — Telephone Encounter (Signed)
I called the patient and spoke to her daughter.  She is on the [facility] form.  Reviewed MRI results with her.  We went over stroke risk factors.  Patient is taking aspirin 81 mg daily.  Advised that I was consulting with Dr. Lucia Gaskins in regards to the imaging and any additional testing that needed to be done.  Will call the patient back with further instructions

## 2023-10-11 ENCOUNTER — Other Ambulatory Visit: Payer: Self-pay | Admitting: Adult Health

## 2023-11-15 ENCOUNTER — Encounter: Payer: Self-pay | Admitting: Adult Health

## 2023-11-15 ENCOUNTER — Ambulatory Visit (INDEPENDENT_AMBULATORY_CARE_PROVIDER_SITE_OTHER): Payer: Medicare Other | Admitting: Adult Health

## 2023-11-15 VITALS — BP 124/82 | HR 77

## 2023-11-15 DIAGNOSIS — Z131 Encounter for screening for diabetes mellitus: Secondary | ICD-10-CM | POA: Diagnosis not present

## 2023-11-15 DIAGNOSIS — I6381 Other cerebral infarction due to occlusion or stenosis of small artery: Secondary | ICD-10-CM

## 2023-11-15 DIAGNOSIS — H532 Diplopia: Secondary | ICD-10-CM

## 2023-11-15 DIAGNOSIS — R251 Tremor, unspecified: Secondary | ICD-10-CM

## 2023-11-15 DIAGNOSIS — R413 Other amnesia: Secondary | ICD-10-CM

## 2023-11-15 MED ORDER — CLOPIDOGREL BISULFATE 75 MG PO TABS: 75.0000 mg | ORAL_TABLET | Freq: Every day | ORAL | 11 refills | Status: DC

## 2023-11-15 NOTE — Progress Notes (Addendum)
 PATIENT: Kimberly Ellison DOB: [birth year]  REASON FOR VISIT: follow up HISTORY FROM: patient PRIMARY NEUROLOGIST: [facility]ern  Sleep Neurologist: Dr. Vickey Huger   Chief Complaint  Patient presents with   Follow-up    Rm 19, daughter.  Tremors,Here to discuss TD Medication, ingrezza prescribed by psychiatrist, not a formulary drug, other options austedo, tetrabenazine.  The medication did help.      HISTORY OF PRESENT ILLNESS: Today 11/15/23:  Kimberly Ellison is a 68 y.o. female with a history of tremor and memory disturbance.  She is here today for stroke workup after finding lacunar infarct on recent MRI.  MRI was completed because she was having double vision.  She has not had any strokelike symptoms.  She did see her psychiatrist who put her on Ingrezza and she does feel that that helped with her tremor although insurance is not covering it now.  She has been on aspirin.  Currently on Lipitor for her cholesterol.    Her last hemoglobin A1c that I see in epic was  in 2024 and was 6.2.  She does have a diagnosis of obstructive sleep apnea but could not tolerate the CPAP.  She does not wish to restart this.  She lives at home alone.  Able to complete all ADLs independently.  Daughter manages her finances.  She continues to operate a motor vehicle without difficulty.  She manages household chores.  Patient reports that she has become involved in very social groups and is participating in different activities.  She states this has been beneficial for her.  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  This is chronic but was not evident on the MRI from 06/05/2020. Elsewhere in the hemispheres, there are some scattered T2/FLAIR hyperintense foci consistent with minimal chronic microvascular ischemic change.  A small chronic lacunar infarction is noted in the left frontal corona  radiata. No acute findings. Normal enhancement pattern  07/24/23: Kimberly Ellison is a 68 y.o. female with a history of tremor and memory disturbance. Returns today for follow-up.  She is here today with her daughter.  She states that 2 weeks ago her tremors worsen.  States that her tremors were keeping her up at night.  States that she will try to lay on her hands in order to keep her hands from tremoring.  She states that they have improved.  Her PCP increased her mirtazapine so she is sleeping better.  She still notices the tremor during the day.  She notices that when she is eating but daughter reports that it also occurs randomly at rest.  She states that she will be fine and then all of a sudden her hands will start shaking.  Left side tends to be worse than right.  Her daughter does note that she has been on an antipsychotic medication since she has been in her 30s.  She has been on Zyprexa for at least 10 years.  Patient also reports that when the tremors worsen 2 weeks ago she also noted double vision.  She only sees a double vision when she looks to the left.  She states that is not constant but is there more than it is not.  Nothing seems to make the double vision worse.  She denies ptosis.  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  She  has never been able to have full horizontal gaze to the left in the left eye.  Denies trouble speaking or swallowing.  Denies weakness in the arms of the legs.    03/28/23: Kimberly Ellison is a 68 y.o. female with a history of tremor and memory disturbance. Returns today for follow-up. She is here alone today.  Memory: some trouble with short term memory. Continues to live with sister. Able to complete all ADLs independently. Manages medications, appointments, and finances. Still driving- no issues. Goes to game night with her sister. Remains and Aricept and namenda   Tremor: Feels that tremor is the same. No  worse    08/31/22: Kimberly Ellison is a 68 year old female with a history of OSA, memory disturbance and headches. Continues to live with her sister. Is her caregiver. Able to complete all ADLS independently. Daughter helps manage medications, appointments and finaces.  Patient still drives.  Daughter states that she has not noticed any discrepancies.  Patient feels that tremor is worse. Notices it both at rest and with activity.  She states that she does not notice the tremor daily.  Maybe 3 to 4 days a week.  No recent changes in medication.  HISTORY 02/16/22  Memory: Daughter feels that memory is slightly worse. Patient feels that memory is the same. Lives with her sister. Still taking care of older sister. Able to complete all ADLs independently. 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 Sometimes patient makes appt but doesn't tell her daughter. No change in mood or behavior. Daughter feels that she struggles with common sense scenarios. Repots that she is reactive instead of rational. Daughter reports that she has trouble reasoning with patient. Sleep ok. Still taking on Aricept 10 mg. Followed by psychiatry- daughter reports that want to put her on lithium   Migraines: no longer having any headaches  OSA: no longer using CPAP. Does not plan to restart  08/12/21: Kimberly Ellison is a 68 year old female with a history of obstructive sleep apnea on CPAP, migraine headaches and memory disturbance.  She returns today for follow-up.  She is here with her daughter.  The daughter is concerned that she should not be living by herself and taking care of her elderly sister.  The patient lives in an apartment with her sister.  She does complete all ADLs independently.  Daughter states that she forgets to take her medication.  Her medications are in a pill pack.  She states that she is also canceling her appointments.  Remains on Aricept 10 mg at bedtime  Patient states that her headaches  have been under good control.  She is no longer on Emgality or Nurtec.  States that she has had trouble using the CPAP.  Reports that she has trouble falling asleep with it on.  She does have the nasal pillows.    REVIEW OF SYSTEMS: Out of a complete 14 system review of symptoms, the patient complains only of the following symptoms, and all other reviewed systems are negative.  ALLERGIES: Allergies  Allergen Reactions   Penicillins Hives    HOME MEDICATIONS: Outpatient Medications Prior to Visit  Medication Sig Dispense Refill   acetaminophen (TYLENOL) 500 MG tablet Take 500-1,500 mg by mouth 3 (three) times daily as needed.      albuterol (PROVENTIL HFA;VENTOLIN HFA) 108 (90 Base) MCG/ACT inhaler Inhale 2 puffs into the lungs every 4 (four) hours as needed for wheezing or shortness of breath. 1 Inhaler 2  aspirin EC 81 MG tablet Take 81 mg by mouth daily. Swallow whole.     atorvastatin (LIPITOR) 10 MG tablet Take 1 tablet by mouth daily.  1   calcium carbonate (OSCAL) 1500 (600 Ca) MG TABS tablet Take 1,500 mg by mouth daily.     cyanocobalamin 500 MCG tablet Take 500 mcg by mouth 2 (two) times daily.      donepezil (ARICEPT) 10 MG tablet TAKE 1 TABLET BY MOUTH AT BEDTIME 30 tablet 11   famotidine (PEPCID) 20 MG tablet One at bedtime 30 tablet 2   lamoTRIgine (LAMICTAL) 100 MG tablet Take 200 mg by mouth daily.     Melatonin 3 MG TABS Take 3 mg by mouth at bedtime.     memantine (NAMENDA) 5 MG tablet TAKE 1 TABLET BY MOUTH AT BEDTIME FOR 7 DAYS THEN  INCREASE  TO  1  TABLET  TWICE  DAILY 60 tablet 11   mirtazapine (REMERON) 7.5 MG tablet Take 15 mg by mouth at bedtime.     montelukast (SINGULAIR) 10 MG tablet Take 10 mg by mouth daily.     Multiple Vitamin (MULTIVITAMIN) capsule Take 1 capsule by mouth daily.     OLANZapine (ZYPREXA) 20 MG tablet Take 20 mg by mouth 2 (two) times daily.     pantoprazole (PROTONIX) 40 MG tablet Take 1 tablet (40 mg total) by mouth daily. Take 30-60  min before first meal of the day 30 tablet 2   No facility-administered medications prior to visit.    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 unsure if intentional.   Transient cerebral ischemia     PAST SURGICAL HISTORY: Past Surgical History:  Procedure Laterality Date   ABDOMINAL HYSTERECTOMY  1999   HIP SURGERY     SI joint surgery 06-2021 (3 titanium rods)   WISDOM TOOTH EXTRACTION      FAMILY HISTORY: Family History  Problem Relation Age of Onset   Lymphoma Mother    Hypertension Mother    Stroke Mother    Dementia Father    Hypertension Sister    Diabetes Sister    Sleep apnea Maternal Aunt    Diabetes Daughter     SOCIAL HISTORY: Social History   Socioeconomic History   Marital status: Divorced    Spouse name: Not on file   Number of children: 1   Years of education: 10   Highest education level: Not on file  Occupational History    Comment: [employer]  Tobacco Use   Smoking status: Former    Current packs/day: 0.00    Average packs/day: 2.0 packs/day for 46.0 years (92.0 ttl pk-yrs)    Types: Cigarettes    Start date: 04/04/1970    Quit date: 04/04/2016    Years since quitting: 7.6   Smokeless tobacco: Never  Vaping Use   Vaping status: Never Used  Substance and Sexual Activity   Alcohol use: No   Drug use: No    Comment: hx cocaine, marijuana  Sexual activity: Not on file  Other Topics Concern   Not on file  Social History Narrative   Lives alone   Sister passed in September 2024   Caffeine: diet coke, 80-96 oz per day     Right handed   Social Drivers of Corporate investment banker Strain: Not on file  Food Insecurity: Not on file  Transportation Needs: Not on file  Physical Activity: Not on file  Stress: Not on file  Social Connections: Unknown (01/03/2022)   Received from [facility]   Social Network    Social Network: Not on file  Intimate Partner Violence: Unknown (11/25/2021)   Received from [facility], [facility]   HITS    Physically Hurt: Not on file    Insult or Talk Down To: Not on file    Threaten Physical Harm: Not on file    Scream or Curse: Not on file      PHYSICAL EXAM  Vitals:   11/15/23 1033  BP: 124/82  Pulse: 77  SpO2: 97%    There is no height or weight on file to calculate BMI.     11/15/2023   11:27 AM 07/24/2023    3:01 PM 03/28/2023   10:49 AM  MMSE - Mini Mental State Exam  Orientation to time 4 4 4   Orientation to Place 4 4 3   Registration 3 3 3   Attention/ Calculation 5 4 4   Recall 3 2 2   Language- name 2 objects 2 2 2   Language- repeat 1 1 1   Language- follow 3 step command 3 3 3   Language- read & follow direction 1 1 1   Write a sentence 1 1 1   Copy design 1 1 1   Total score 28 26 25      Generalized: Well developed, in no acute distress   Neurological examination  Mentation: Alert. Follows all commands speech and language fluent Cranial nerve II-XII: Pupils were equal round reactive to light. Extraocular movements were full, visual field were full on confrontational test with exception of left eye- deviates to the left with superior gaze. Facial sensation and strength were normal. Uvula tongue midline. Head turning and shoulder shrug  were normal and symmetric.   Motor: The motor testing reveals 5 over 5 strength of all 4 extremities. Good symmetric motor tone is noted throughout.  Intermittent resting and action tremor noted in the upper extremities left greater than right Sensory: Sensory testing is intact to soft touch on all  4 extremities. No evidence of extinction is noted.  Coordination: Cerebellar testing reveals good finger-nose-finger and heel-to-shin bilaterally.  Gait and station: Gait is normal.    DIAGNOSTIC DATA (LABS, IMAGING, TESTING) - I reviewed patient records, labs, notes, testing and imaging myself where available.  Lab Results  Component Value Date   WBC 8.5 05/14/2020   HGB 13.4 05/14/2020   HCT 38.7 05/14/2020   MCV 109 (H) 05/14/2020   PLT 305 05/14/2020      Component Value Date/Time   NA 134 05/19/2020 1010   K 4.8 05/19/2020 1010   CL 96 05/19/2020 1010   CO2 21 05/19/2020 1010   GLUCOSE 139 (H) 05/19/2020 1010   BUN 12 05/19/2020 1010   CREATININE 0.77 05/19/2020 1010   CALCIUM 11.6 (H) 05/19/2020 1010   PROT 6.7 05/14/2020 1414   ALBUMIN 4.6 05/14/2020 1414   AST 21 05/14/2020 1414   ALT 23 05/14/2020 1414   ALKPHOS 75 05/14/2020 1414   BILITOT 0.2  05/14/2020 1414   GFRNONAA 83 05/19/2020 1010   GFRAA 96 05/19/2020 1010   Lab Results  Component Value Date   VITAMINB12 1,037 05/19/2020   Lab Results  Component Value Date   TSH 2.680 05/14/2020      ASSESSMENT AND PLAN 68 y.o. year old female  has a past medical history of Anemia, Asthma, Bipolar 1 disorder (HCC), Constipation, COPD (chronic obstructive pulmonary disease) (HCC), Dementia (HCC), Diabetes (HCC), DJD (degenerative joint disease), GERD (gastroesophageal reflux disease), Headache, new daily persistent (NDPH) (04/2020), Infection (2020), Insomnia, Overdose (08/2018), and Transient cerebral ischemia. here with:   Lacunar Infarct left frontal corona radiata.  - Blood work today- lipid panel, hemoglobin A1c - CTA head and neck with and without contrast - Echocardiogram with bubble study - 30-day cardiac monitor - Stop aspirin - Start Plavix 75 mg daily   2. Tremor  Feels that Allena Earing has been helpful but insurance will no longer approve.  She is working with her psychiatrist. Not sure that  tremor is an essential tremor or perhaps side effect related to antipsychotic medication.  As the tremor also appears at rest.  No other signs of parkinsonism.  3. Memory disturbance  MMSE 28/30- stable Continue Aricept 10 mg daily at bedtime Continue Namenda 10 mg BID  FU in 3 months with Dr. Gerlene Fee, MSN, NP-C 11/15/2023, 10:42 AM [facility] [address] [place], [place] [address] [phone]  Reviewed and agree with plan Naomie Dean, MD

## 2023-11-15 NOTE — Patient Instructions (Addendum)
 Stop Aspirin Start Plavix 75 mg daily  CT angiogram head and neck with and without contrast Echocardiogram with bubble study  30 day cardiac monitor   Blood pressure goal <130/90 Cholesterol LDL goal <70 Diabetes goal A1c <7  Thank you for coming to see [place] at [facility]. I hope we have been able to provide you high quality care today.  You may receive a patient satisfaction survey over the next few weeks. We would appreciate your feedback and comments so that we may continue to improve ourselves and the health of our patients.

## 2023-11-16 LAB — HEMOGLOBIN A1C
Est. average glucose Bld gHb Est-mCnc: 126 mg/dL
Hgb A1c MFr Bld: 6 % — ABNORMAL HIGH (ref 4.8–5.6)

## 2023-11-16 LAB — LIPID PANEL
Chol/HDL Ratio: 2.6 ratio (ref 0.0–4.4)
Cholesterol, Total: 168 mg/dL (ref 100–199)
HDL: 65 mg/dL (ref >39)
LDL Chol Calc (NIH): 79 mg/dL (ref 0–99)
Triglycerides: 143 mg/dL (ref 0–149)
VLDL Cholesterol Cal: 24 mg/dL (ref 5–40)

## 2023-11-21 ENCOUNTER — Telehealth: Payer: Self-pay | Admitting: *Deleted

## 2023-11-21 NOTE — Telephone Encounter (Signed)
-----   Message from [facility] sent at 11/16/2023  3:48 PM EDT ----- Please let patient's daughter know that hemoglobin A1c was 6.  This would indicate prediabetes.  Diet management and exercise will help maintain her lower this number.  We can send results to PCP.  Cholesterol panel is okay.

## 2023-11-21 NOTE — Telephone Encounter (Signed)
 I called and spoke to daughter, Cristie Hem (ok per DPR).  Relayed that her A1c 6, prediabetes range, recommend diet management and exercise.  Cholesterol panel was okay.  She verbalized understanding.

## 2023-11-22 ENCOUNTER — Ambulatory Visit
Admission: RE | Admit: 2023-11-22 | Discharge: 2023-11-22 | Disposition: A | Discharge status: Home / Self Care | Source: Ambulatory Visit | Attending: Adult Health | Admitting: Adult Health

## 2023-11-22 DIAGNOSIS — I6381 Other cerebral infarction due to occlusion or stenosis of small artery: Secondary | ICD-10-CM

## 2023-11-22 MED ORDER — IOPAMIDOL (ISOVUE-370) INJECTION 76%
75.0000 mL | Freq: Once | INTRAVENOUS | Status: AC | PRN
  Administered 2023-11-22: 75 mL via INTRAVENOUS

## 2023-12-07 ENCOUNTER — Ambulatory Visit (HOSPITAL_COMMUNITY)
Admission: RE | Admit: 2023-12-07 | Discharge: 2023-12-07 | Disposition: A | Discharge status: Home / Self Care | Source: Ambulatory Visit | Attending: Adult Health | Admitting: Adult Health

## 2023-12-07 DIAGNOSIS — G4730 Sleep apnea, unspecified: Secondary | ICD-10-CM | POA: Insufficient documentation

## 2023-12-07 DIAGNOSIS — I6381 Other cerebral infarction due to occlusion or stenosis of small artery: Secondary | ICD-10-CM

## 2023-12-07 LAB — ECHOCARDIOGRAM COMPLETE BUBBLE STUDY
AR max vel: 2.64 cm2
AV Area VTI: 3.26 cm2
AV Area mean vel: 2.75 cm2
AV Mean grad: 2 mmHg
AV Peak grad: 4.8 mmHg
Ao pk vel: 1.1 m/s
Area-P 1/2: 2.79 cm2
Calc EF: 60.9 %
MV VTI: 3.13 cm2
S' Lateral: 2.7 cm
Single Plane A2C EF: 58.7 %
Single Plane A4C EF: 63 %

## 2023-12-07 NOTE — Progress Notes (Signed)
  Echocardiogram 2D Echocardiogram has been performed.  Mariselda Badalamenti L Karlye Ihrig RDCS 12/07/2023, 8:47 AM

## 2023-12-11 ENCOUNTER — Encounter: Payer: Self-pay | Admitting: Neurology

## 2023-12-13 NOTE — Telephone Encounter (Signed)
 Relayed results to daughter, Annamaria Kicks,  of pt.  She verbalized understanding.  Has appt with Dr. Tresia Fruit tomorrow.

## 2023-12-13 NOTE — Telephone Encounter (Signed)
-----   Message from Stanton Dohmeier sent at 12/11/2023  1:09 PM EDT ----- Normal EF and normal ventricular and valvular function.

## 2023-12-14 ENCOUNTER — Ambulatory Visit: Payer: Medicare Other | Admitting: Neurology

## 2023-12-14 ENCOUNTER — Encounter: Payer: Self-pay | Admitting: Neurology

## 2023-12-14 ENCOUNTER — Telehealth: Payer: Self-pay | Admitting: Neurology

## 2023-12-14 VITALS — BP 121/80 | HR 84 | Ht 64.0 in | Wt 182.8 lb

## 2023-12-14 DIAGNOSIS — I6310 Cerebral infarction due to embolism of unspecified precerebral artery: Secondary | ICD-10-CM | POA: Insufficient documentation

## 2023-12-14 DIAGNOSIS — I639 Cerebral infarction, unspecified: Secondary | ICD-10-CM | POA: Diagnosis not present

## 2023-12-14 NOTE — Patient Instructions (Addendum)
 We will get the CT of the arteries result and let you know We will get the report of your heart monitor and the next steps would be loop recorder  Implantable Loop Recorder Placement  An implantable loop recorder is a small electronic device that is placed under the skin of the chest. The device records the electrical activity of the heart over a long period of time. Your health care provider can download these recordings to monitor your heart. You may need an implantable loop recorder if you have periods of abnormal heart activity (arrhythmias) or unexplained fainting (syncope). The recorder can be left in place for as long as 3 years. Tell a health care provider about: Any allergies you have. All medicines you are taking, including vitamins, herbs, eye drops, creams, and over-the-counter medicines. Any problems you or family members have had with anesthesia. Any bleeding problems you have. Any surgeries you have had. Any medical conditions you have. Whether you are pregnant or may be pregnant. What are the risks? Your health care provider will talk with you about risks. These may include: Infection. Bleeding. Allergic reactions to anesthesia. Damage to nerves or blood vessels. Failure of the device to work. This could require another surgery to replace it. What happens before the procedure? You may have a physical exam, blood tests, and imaging tests, such as a chest X-ray. Follow instructions from your health care provider about what you may eat and drink Ask your health care provider about: Changing or stopping your regular medicines. These include any diabetes medicines or blood thinners you take. Taking medicines such as aspirin and ibuprofen. These medicines can thin your blood. Do not take them unless your health care provider tells you to. Taking over-the-counter medicines, vitamins, herbs, and supplements. Ask your health care provider: How your surgery site will be  marked. What steps will be taken to help prevent infection. These steps may include: Removing hair at the surgery site. Washing skin with a soap that kills germs. Taking antibiotics. If you will be going home right after the procedure, plan to have a responsible adult: Take you home from the hospital or clinic. You will not be allowed to drive. Do not use any products that contain nicotine or tobacco before the procedure. These products include cigarettes, chewing tobacco, and vaping devices, such as e-cigarettes. If you need help quitting, ask your health care provider What happens during the procedure? An IV will be inserted into one of your veins. You may be given: A sedative. This helps you relax Anesthesia. This will: Numb certain areas of your body. Make you fall asleep for surgery. A small incision will be made on the left side of your upper chest. A pocket will be created under your skin. The device will be placed in the pocket. The incision will be closed with stitches (sutures) or adhesive strips. A bandage (dressing) will be placed over the incision. The procedure may vary among health care providers and hospitals. What happens after the procedure? Your blood pressure, heart rate, breathing rate, and blood oxygen level will be monitored until you leave the hospital or clinic. You may be able to go home on the day of your surgery. Before you go home: Your health care provider will program your recorder. You will learn how to trigger your device with a handheld activator. You will learn how to send recordings to your health care provider. You will get an ID card for your device, and you will be told  when to use it. This information is not intended to replace advice given to you by your health care provider. Make sure you discuss any questions you have with your health care provider. Document Revised: 01/03/2022 Document Reviewed: 01/03/2022 Elsevier Patient Education  2024  [employer].

## 2023-12-14 NOTE — Progress Notes (Signed)
 PATIENT: Kimberly Ellison DOB: Mar 15, 1956  REASON FOR VISIT: follow up HISTORY FROM: patient PRIMARY NEUROLOGIST: Nasire Reali  Sleep Neurologist: Dr. Albertina Hugger   Chief Complaint  Patient presents with   Room 4    Pt is here with her Daughter. Pt's daughter states that pt has on heart monitor. Pt states that she has been doing good since last appointment.      HISTORY OF PRESENT ILLNESS:  12/14/2023: She is here today with her daughter. Last seen less than a month ago. Hx of tremor and memory disturbance. Was seen for stroke workup after infarct that appears embolic causing vision changes (occipital visual fields). On plavix  for secondary stroke protection, managing her cholesterol and hgba1c. At last appointment a full stroke evaluation was ordered including CTA H&N (pending read), heart monitor, echo. She has been on anti-pyshotics for years with extrapyramidal symptoms and the ingrezza has been helping. Her sister passed away in 05/05/2023. Now patient is living by herself and she is fine, able to do everything, currently still driving recommend formal driving exam. Daughter is here and provides much information. Her short term memory is worsening (but last MMSE 28/30). Highly encouraged driving test discussed with patient and daughter however she has not had any accidents in the last 4-5 years, has not gotten lost, duaghter has 3 children so she can;t be there all the time. Patient never misses her medications, she lives in an apartment  Patient complains of symptoms per HPI as well as the following symptoms: none . Pertinent negatives and positives per HPI. All others negative     Today 11/15/2023:  Kimberly Ellison is a 68 y.o. female with a history of tremor and memory disturbance.  She is here today for stroke workup after finding lacunar infarct on recent MRI.  MRI was completed because she was having double vision.  She has not had any strokelike symptoms.  She did see her psychiatrist who  put her on Ingrezza and she does feel that that helped with her tremor although insurance is not covering it now.  She has been on aspirin.  Currently on Lipitor for her cholesterol.    Her last hemoglobin A1c that I see in epic was  in 2024 and was 6.2.  She does have a diagnosis of obstructive sleep apnea but could not tolerate the CPAP.  She does not wish to restart this.  She lives at home alone.  Able to complete all ADLs independently.  Daughter manages her finances.  She continues to operate a motor vehicle without difficulty.  She manages household chores.  Patient reports that she has become involved in very social groups and is participating in different activities.  She states this has been beneficial for her.  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  This is chronic but was not evident on the MRI from 06/05/2020. Elsewhere in the hemispheres, there are some scattered T2/FLAIR hyperintense foci consistent with minimal chronic microvascular ischemic change.  A small chronic lacunar infarction is noted in the left frontal corona radiata. No acute findings. Normal enhancement pattern  07/24/23: Kimberly Ellison is a 68 y.o. female with a history of tremor and memory disturbance. Returns today for follow-up.  She is here today with her daughter.  She states that 2 weeks ago her tremors worsen.  States that her tremors were keeping her up at night.  States that she will try to lay on her hands in order to keep her hands from tremoring.  She states that they have improved.  Her PCP increased her mirtazapine so she is sleeping better.  She still notices the tremor during the day.  She notices that when she is eating but daughter reports that it also occurs randomly at rest.  She states that she will be fine and then all of a sudden her hands will start shaking.  Left side tends to be  worse than right.  Her daughter does note that she has been on an antipsychotic medication since she has been in her 30s.  She has been on Zyprexa for at least 10 years.  Patient also reports that when the tremors worsen 2 weeks ago she also noted double vision.  She only sees a double vision when she looks to the left.  She states that is not constant but is there more than it is not.  Nothing seems to make the double vision worse.  She denies ptosis.  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  She has never been able to have full horizontal gaze to the left in the left eye.  Denies trouble speaking or swallowing.  Denies weakness in the arms of the legs.    03/28/23: Kimberly Ellison is a 68 y.o. female with a history of tremor and memory disturbance. Returns today for follow-up. She is here alone today.  Memory: some trouble with short term memory. Continues to live with sister. Able to complete all ADLs independently. Manages medications, appointments, and finances. Still driving- no issues. Goes to game night with her sister. Remains and Aricept  and namenda    Tremor: Feels that tremor is the same. No worse    08/31/22: [name] is a 68 year old female with a history of OSA, memory disturbance and headches. Continues to live with her sister. Is her caregiver. Able to complete all ADLS independently. Daughter helps manage medications, appointments and finaces.  Patient still drives.  Daughter states that she has not noticed any discrepancies.  Patient feels that tremor is worse. Notices it both at rest and with activity.  She states that she does not notice the tremor daily.  Maybe 3 to 4 days a week.  No recent changes in medication.  HISTORY 02/16/22  Memory: Daughter feels that memory is slightly worse. Patient feels that memory is the same. Lives with her sister. Still taking care of older sister. Able to complete all ADLs independently. Operates a Electronics engineer. Daughter keeps up with appts and medications. Sometimes patient makes appt but doesn't tell her daughter. No change in mood or behavior. Daughter feels that she struggles with common sense scenarios. Repots that she is reactive instead of rational. Daughter reports that she has trouble reasoning with patient. Sleep ok. Still taking on Aricept  10 mg. Followed by psychiatry- daughter reports that want to put her on lithium   Migraines: no longer having any headaches  OSA: no longer using CPAP. Does not plan to restart  08/12/21: Ms. Shiroma is a 68 year old female with a history of obstructive sleep apnea on CPAP, migraine headaches and memory disturbance.  She returns today for follow-up.  She is here with her daughter.  The daughter is concerned that she should not be living by herself and taking care of her elderly sister.  The patient lives in an apartment with  her sister.  She does complete all ADLs independently.  Daughter states that she forgets to take her medication.  Her medications are in a pill pack.  She states that she is also canceling her appointments.  Remains on Aricept  10 mg at bedtime  Patient states that her headaches have been under good control.  She is no longer on Emgality  or Nurtec.  States that she has had trouble using the CPAP.  Reports that she has trouble falling asleep with it on.  She does have the nasal pillows.    REVIEW OF SYSTEMS: Out of a complete 14 system review of symptoms, the patient complains only of the following symptoms, and all other reviewed systems are negative.  ALLERGIES: Allergies  Allergen Reactions   Penicillins Hives    HOME MEDICATIONS: Outpatient Medications Prior to Visit  Medication Sig Dispense Refill   acetaminophen (TYLENOL) 500 MG tablet Take 500-1,500 mg by mouth 3 (three) times daily as needed.      albuterol  (PROVENTIL  HFA;VENTOLIN  HFA) 108 (90 Base) MCG/ACT inhaler Inhale 2 puffs into the lungs every 4 (four) hours as  needed for wheezing or shortness of breath. 1 Inhaler 2   atorvastatin (LIPITOR) 10 MG tablet Take 1 tablet by mouth daily.  1   calcium carbonate (OSCAL) 1500 (600 Ca) MG TABS tablet Take 1,500 mg by mouth daily.     clopidogrel  (PLAVIX ) 75 MG tablet Take 1 tablet (75 mg total) by mouth daily. 30 tablet 11   cyanocobalamin 500 MCG tablet Take 500 mcg by mouth 2 (two) times daily.      donepezil  (ARICEPT ) 10 MG tablet TAKE 1 TABLET BY MOUTH AT BEDTIME 30 tablet 11   famotidine  (PEPCID ) 20 MG tablet One at bedtime 30 tablet 2   lamoTRIgine  (LAMICTAL ) 100 MG tablet Take 200 mg by mouth daily.     Melatonin 3 MG TABS Take 3 mg by mouth at bedtime.     memantine  (NAMENDA ) 5 MG tablet TAKE 1 TABLET BY MOUTH AT BEDTIME FOR 7 DAYS THEN  INCREASE  TO  1  TABLET  TWICE  DAILY 60 tablet 11   mirtazapine (REMERON) 7.5 MG tablet Take 15 mg by mouth at bedtime.     montelukast (SINGULAIR) 10 MG tablet Take 10 mg by mouth daily.     Multiple Vitamin (MULTIVITAMIN) capsule Take 1 capsule by mouth daily.     OLANZapine (ZYPREXA) 20 MG tablet Take 20 mg by mouth 2 (two) times daily.     pantoprazole  (PROTONIX ) 40 MG tablet Take 1 tablet (40 mg total) by mouth daily. Take 30-60 min before first meal of the day 30 tablet 2   No facility-administered medications prior to visit.    PAST MEDICAL HISTORY: Past Medical History:  Diagnosis Date   Anemia    Asthma    Bipolar 1 disorder (HCC)    Constipation    COPD (chronic obstructive pulmonary disease) (HCC)    Dementia (HCC)    Diabetes (HCC)    DJD (degenerative joint disease)    GERD (gastroesophageal reflux disease)    Headache, new daily persistent ([facility]) 04/2020   Infection 2020   stomach infection   Insomnia    Overdose 08/2018   per daughter, hospitalized at [facility]. unsure if intentional.   Transient cerebral ischemia     PAST SURGICAL HISTORY: Past Surgical History:  Procedure Laterality Date   ABDOMINAL HYSTERECTOMY  1999   HIP SURGERY      SI joint surgery  06-2021 (3 titanium rods)   WISDOM TOOTH EXTRACTION      FAMILY HISTORY: Family History  Problem Relation Age of Onset   Lymphoma Mother    Hypertension Mother    Stroke Mother    Dementia Father    Hypertension Sister    Diabetes Sister    Sleep apnea Maternal Aunt    Diabetes Daughter     SOCIAL HISTORY: Social History   Socioeconomic History   Marital status: Divorced    Spouse name: Not on file   Number of children: 1   Years of education: 10   Highest education level: Not on file  Occupational History    Comment: [employer]  Tobacco Use   Smoking status: Former    Current packs/day: 0.00    Average packs/day: 2.0 packs/day for 46.0 years (92.0 ttl pk-yrs)    Types: Cigarettes    Start date: 04/04/1970    Quit date: 04/04/2016    Years since quitting: 7.6   Smokeless tobacco: Never  Vaping Use   Vaping status: Never Used  Substance and Sexual Activity   Alcohol use: No   Drug use: No    Comment: hx cocaine, marijuana    Sexual activity: Not on file  Other Topics Concern   Not on file  Social History Narrative   Lives alone   Sister passed in September 2024   Caffeine: diet coke, 80-96 oz per day    Right handed   Social Drivers of Corporate investment banker Strain: Not on file  Food Insecurity: Not on file  Transportation Needs: Not on file  Physical Activity: Not on file  Stress: Not on file  Social Connections: Unknown (01/03/2022)   Received from [facility], [facility]   Social Network    Social Network: Not on file  Intimate Partner Violence: Unknown (11/25/2021)   Received from [employer], [facility]   HITS    Physically Hurt: Not on file    Insult or Talk Down To: Not on file    Threaten Physical Harm: Not on file    Scream or Curse: Not on file      PHYSICAL EXAM  Vitals:   12/14/23 1357  BP: 121/80  Pulse: 84  Weight: 182 lb 12.8 oz (82.9 kg)  Height: 5\' 4"  (1.626 m)    Body mass index is  31.38 kg/m.     11/15/2023   11:27 AM 07/24/2023    3:01 PM 03/28/2023   10:49 AM  MMSE - Mini Mental State Exam  Orientation to time 4 4 4   Orientation to Place 4 4 3   Registration 3 3 3   Attention/ Calculation 5 4 4   Recall 3 2 2   Language- name 2 objects 2 2 2   Language- repeat 1 1 1   Language- follow 3 step command 3 3 3   Language- read & follow direction 1 1 1   Write a sentence 1 1 1   Copy design 1 1 1   Total score 28 26 25    Exam: NAD, pleasant                  Speech:    Speech is normal; fluent and spontaneous with normal comprehension.  Cognition:    The patient is oriented to person, place, and time;     recent and remote memory intact;     language fluent;    Cranial Nerves:    The pupils are equal, round, and reactive to light.Trigeminal  sensation is intact and the muscles of mastication are normal. The face is symmetric. The palate elevates in the midline. Hearing intact. Voice is normal. Shoulder shrug is normal. The tongue has normal motion without fasciculations.   Coordination:  No dysmetria  Motor Observation:    No asymmetry, no atrophy, and no involuntary movements noted. Tone:    Normal muscle tone.     Strength:    Strength is V/V in the upper and lower limbs.      Sensation: intact to LT    DIAGNOSTIC DATA (LABS, IMAGING, TESTING) - I reviewed patient records, labs, notes, testing and imaging myself where available.  Lab Results  Component Value Date   WBC 8.5 05/14/2020   HGB 13.4 05/14/2020   HCT 38.7 05/14/2020   MCV 109 (H) 05/14/2020   PLT 305 05/14/2020      Component Value Date/Time   NA 134 05/19/2020 1010   K 4.8 05/19/2020 1010   CL 96 05/19/2020 1010   CO2 21 05/19/2020 1010   GLUCOSE 139 (H) 05/19/2020 1010   BUN 12 05/19/2020 1010   CREATININE 0.77 05/19/2020 1010   CALCIUM 11.6 (H) 05/19/2020 1010   PROT 6.7 05/14/2020 1414   ALBUMIN 4.6 05/14/2020 1414   AST 21 05/14/2020 1414   ALT 23 05/14/2020 1414   ALKPHOS  75 05/14/2020 1414   BILITOT 0.2 05/14/2020 1414   GFRNONAA 83 05/19/2020 1010   GFRAA 96 05/19/2020 1010   Lab Results  Component Value Date   VITAMINB12 1,037 05/19/2020   Lab Results  Component Value Date   TSH 2.680 05/14/2020      ASSESSMENT AND PLAN 68 y.o. year old female  has a past medical history of Anemia, Asthma, Bipolar 1 disorder (HCC), Constipation, COPD (chronic obstructive pulmonary disease) (HCC), Dementia (HCC), Diabetes (HCC), DJD (degenerative joint disease), GERD (gastroesophageal reflux disease), Headache, new daily persistent ([facility]) (04/2020), Infection (2020), Insomnia, Overdose (08/2018), and Transient cerebral ischemia. here with:   infarction involving the right occipital temporal gyrus, appears embolic  - Blood work today- lipid panel, hemoglobin A1c completed - CTA head and neck with and without contrast: pending read - Echocardiogram with bubble study: unremarkable - 30-day cardiac monitor: currently ongoing if negative ask for loop as stroke appears embolic in etiology - Continue Plavix  75 mg daily   I had a long d/w patient about her recent stroke, risk for recurrent stroke/TIAs, personally independently reviewed imaging studies and stroke evaluation results and answered questions.Continue Plavix   for secondary stroke prevention and maintain strict control of hypertension with blood pressure goal below 130/90, diabetes with hemoglobin A1c goal below 6.5% and lipids with LDL cholesterol goal below 70 mg/dL. I also advised the patient to eat a healthy diet with plenty of whole grains, lean protein, fruits and vegetables, exercise regularly and maintain ideal body weight .  2. Tremor  Feels that Volanda Gruber has been helpful, may consider increasing Tremor appears at rest but no other parkinsonian signs. Likely extrapyradimal symptoms due to anti-dopa drugs as improved with ingrezza  3. Memory disturbance  MMSE 28/30- stable and improved from  prior Continue Aricept  10 mg daily at bedtime Continue Namenda  10 mg BID  FU in 6 months with Ileana Mallard MD [facility] [address] [place], [place] [address] [phone]  I spent over 40 minutes of face-to-face and non-face-to-face time with patient on the  1. Occipital stroke (HCC)   2. 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  This included previsit chart review, lab review, study review, order entry, electronic health record documentation, patient education on the different diagnostic and therapeutic options, counseling and coordination of care, risks and benefits of management, compliance, or risk factor reduction

## 2023-12-14 NOTE — Telephone Encounter (Signed)
 Can you get the ct angiograms read please its been since 4/2 , thank you !

## 2023-12-18 ENCOUNTER — Telehealth: Payer: Self-pay | Admitting: Neurology

## 2023-12-18 NOTE — Telephone Encounter (Signed)
 Unable to LVM, VM box full. Sent mychart msg informing pt of need to reschedule 03/05/24 appt - MD out   If patient calls back to reschedule you can offer an appointment at the end of August with Dr. Tresia Fruit

## 2023-12-20 ENCOUNTER — Telehealth: Payer: Self-pay

## 2023-12-20 NOTE — Telephone Encounter (Addendum)
 Called pt's daughter and relayed the below results per NP, Samson Croak. Pt's daughter stated that it was great news and thanked me for calling.  ----- Message from Clem Currier sent at 12/18/2023 10:02 AM EDT ----- Please call patient or the patient's daughter.  Let them know that there was no significant abnormalities on the CTA.  The esophagus did show mildly diffuse wall thickening.  If the patient has any symptoms of esophagitis and refer to PCP for evaluation

## 2023-12-28 ENCOUNTER — Ambulatory Visit: Attending: Adult Health

## 2023-12-28 DIAGNOSIS — I6381 Other cerebral infarction due to occlusion or stenosis of small artery: Secondary | ICD-10-CM

## 2023-12-29 DIAGNOSIS — I6381 Other cerebral infarction due to occlusion or stenosis of small artery: Secondary | ICD-10-CM

## 2024-01-01 ENCOUNTER — Encounter: Payer: Self-pay | Admitting: Adult Health

## 2024-03-05 ENCOUNTER — Ambulatory Visit: Admitting: Neurology

## 2024-05-13 ENCOUNTER — Other Ambulatory Visit: Payer: Self-pay | Admitting: Adult Health

## 2024-07-15 ENCOUNTER — Telehealth: Payer: Self-pay | Admitting: Adult Health

## 2024-07-15 NOTE — Telephone Encounter (Signed)
LVM and sent mychart msg informing pt of need to reschedule 09/12/23 appt - NP out

## 2024-07-22 ENCOUNTER — Ambulatory Visit: Admitting: Adult Health

## 2024-07-31 ENCOUNTER — Ambulatory Visit: Admitting: Adult Health

## 2024-08-05 ENCOUNTER — Other Ambulatory Visit: Payer: Self-pay | Admitting: Adult Health

## 2024-08-05 NOTE — Telephone Encounter (Signed)
 Spoke to daughter (checked dpr) Made f/u visit for patient  08/27/2024

## 2024-08-27 ENCOUNTER — Ambulatory Visit (INDEPENDENT_AMBULATORY_CARE_PROVIDER_SITE_OTHER): Admitting: Adult Health

## 2024-08-27 ENCOUNTER — Encounter: Payer: Self-pay | Admitting: Adult Health

## 2024-08-27 VITALS — BP 136/89 | HR 79 | Ht 63.0 in | Wt 186.0 lb

## 2024-08-27 DIAGNOSIS — I6381 Other cerebral infarction due to occlusion or stenosis of small artery: Secondary | ICD-10-CM

## 2024-08-27 DIAGNOSIS — R413 Other amnesia: Secondary | ICD-10-CM

## 2024-08-27 MED ORDER — MEMANTINE HCL 5 MG PO TABS: 5.0000 mg | ORAL_TABLET | Freq: Two times a day (BID) | ORAL | 3 refills | Status: AC

## 2024-08-27 MED ORDER — CLOPIDOGREL BISULFATE 75 MG PO TABS: 75.0000 mg | ORAL_TABLET | Freq: Every day | ORAL | 3 refills | Status: AC

## 2024-08-27 MED ORDER — DONEPEZIL HCL 10 MG PO TABS: 10.0000 mg | ORAL_TABLET | Freq: Every day | ORAL | 3 refills | Status: AC

## 2024-08-27 NOTE — Patient Instructions (Addendum)
 Your Plan:  Continue Aricept  and Namenda .  Memory score is stable  Continue Plavix  for stroke prevention Blood pressure goal <130/90 Cholesterol LDL goal <70 Diabetes goal A1c <7 Monitor diet and try to exercise   Thank you for coming to see us  at [facility]. I hope we have been able to provide you high quality care today.  You may receive a patient satisfaction survey over the next few weeks. We would appreciate your feedback and comments so that we may continue to improve ourselves and the health of our patients.

## 2024-08-27 NOTE — Progress Notes (Signed)
 "   PATIENT: Kimberly Ellison DOB: July 13, 1956  REASON FOR VISIT: follow up HISTORY FROM: patient  Chief Complaint  Patient presents with   Cerebrovascular Accident    Rm 18 with daughter  Pt is well and stable from a stroke standpoint. Daughter states memory and cognition has decline since last visit.      HISTORY OF PRESENT ILLNESS: Today 08/27/2024:  Kimberly  A Ellison is a 69 y.o. female with a history of stroke and memory disturbance. Returns today for follow-up.  Overall she reports that she has been doing well.  Denies any strokelike symptoms.  Remains on Plavix .  Primary care manages cholesterol and diabetes.  Last hemoglobin A1c was in normal range.  LDL was 71.  Her daughter feels that her short-term memory and cognition has gotten slightly worse.  She continues to live at home alone.  Able to complete all ADLs independently.  Daughter does manage her medications and finances.  She does manage her own appointments.  She currently drives.  Daughter reports that overall she feels that she is relatively safe with driving but this is something they continue to monitor.  She remains on Aricept  and Namenda .  Returns today for an evaluation.   12/14/23: Kimberly  A Ellison is a 69 y.o. female with a history of tremor and memory disturbance.  She is here today for stroke workup after finding lacunar infarct on recent MRI.  MRI was completed because she was having double vision.  She has not had any strokelike symptoms.  She did see her psychiatrist who put her on Ingrezza and she does feel that that helped with her tremor although insurance is not covering it now.  She has been on aspirin.  Currently on Lipitor for her cholesterol.    Her last hemoglobin A1c that I see in epic was  in 2024 and was 6.2.  She does have a diagnosis of obstructive sleep apnea but could not tolerate the CPAP.  She does not wish to restart this.  She lives at home alone.  Able to complete all ADLs independently.  Daughter  manages her finances.  She continues to operate a motor vehicle without difficulty.  She manages household chores.  Patient reports that she has become involved in very social groups and is participating in different activities.  She states this has been beneficial for her.  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  This is chronic but was not evident on the MRI from 06/05/2020. Elsewhere in the hemispheres, there are some scattered T2/FLAIR hyperintense foci consistent with minimal chronic microvascular ischemic change.  A small chronic lacunar infarction is noted in the left frontal corona radiata. No acute findings. Normal enhancement pattern  07/24/23: Kimberly  A Ellison is a 69 y.o. female with a history of tremor and memory disturbance. Returns today for follow-up.  She is here today with her daughter.  She states that 2 weeks ago her tremors worsen.  States that her tremors were keeping her up at night.  States that she will try to lay on her hands in order to keep her hands from tremoring.  She states that they have improved.  Her PCP increased her mirtazapine so she is sleeping better.  She still notices the tremor during the day.  She notices that when she is eating but daughter reports that it also occurs randomly at rest.  She states  that she will be fine and then all of a sudden her hands will start shaking.  Left side tends to be worse than right.  Her daughter does note that she has been on an antipsychotic medication since she has been in her 30s.  She has been on Zyprexa for at least 10 years.  Patient also reports that when the tremors worsen 2 weeks ago she also noted double vision.  She only sees a double vision when she looks to the left.  She states that is not constant but is there more than it is not.  Nothing seems to make the double vision worse.  She denies ptosis.   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  She has never been able to have full horizontal gaze to the left in the left eye.  Denies trouble speaking or swallowing.  Denies weakness in the arms of the legs.    03/28/23: Kimberly  A Ellison is a 69 y.o. female with a history of tremor and memory disturbance. Returns today for follow-up. She is here alone today.  Memory: some trouble with short term memory. Continues to live with sister. Able to complete all ADLs independently. Manages medications, appointments, and finances. Still driving- no issues. Goes to game night with her sister. Remains and Aricept  and namenda    Tremor: Feels that tremor is the same. No worse    08/31/22: Kimberly Ellison is a 69 year old female with a history of OSA, memory disturbance and headches. Continues to live with her sister. Is her caregiver. Able to complete all ADLS independently. Daughter helps manage medications, appointments and finaces.  Patient still drives.  Daughter states that she has not noticed any discrepancies.  Patient feels that tremor is worse. Notices it both at rest and with activity.  She states that she does not notice the tremor daily.  Maybe 3 to 4 days a week.  No recent changes in medication.  HISTORY 02/16/22  Memory: Daughter feels that memory is slightly worse. Patient feels that memory is the same. Lives with her sister. Still taking care of older sister. Able to complete all ADLs independently. Operates a librarian, academic. Daughter keeps up with appts and medications. Sometimes patient makes appt but doesn't tell her daughter. No change in mood or behavior. Daughter feels that she struggles with common sense scenarios. Repots that she is reactive instead of rational. Daughter reports that she has trouble reasoning with patient. Sleep ok. Still taking on Aricept  10 mg. Followed by psychiatry- daughter reports that want to put her on lithium   Migraines: no longer  having any headaches  OSA: no longer using CPAP. Does not plan to restart  08/12/21: Kimberly Ellison is a 69 year old female with a history of obstructive sleep apnea on CPAP, migraine headaches and memory disturbance.  She returns today for follow-up.  She is here with her daughter.  The daughter is concerned that she should not be living by herself and taking care of her elderly sister.  The patient lives in an apartment with her sister.  She does complete all ADLs independently.  Daughter states that she forgets to take her medication.  Her medications are in a pill pack.  She states that she is also canceling her appointments.  Remains on Aricept  10 mg at bedtime  Patient states that her headaches have been under good control.  She is no longer on Emgality  or Nurtec.  States that she has had trouble using the CPAP.  Reports that she has trouble falling asleep with it on.  She does have the nasal pillows.    REVIEW OF SYSTEMS: Out of a complete 14 system review of symptoms, the patient complains only of the following symptoms, and all other reviewed systems are negative.  ALLERGIES: Allergies  Allergen Reactions   Penicillins Hives    HOME MEDICATIONS: Outpatient Medications Prior to Visit  Medication Sig Dispense Refill   acetaminophen (TYLENOL) 500 MG tablet Take 500-1,500 mg by mouth 3 (three) times daily as needed.      albuterol  (PROVENTIL  HFA;VENTOLIN  HFA) 108 (90 Base) MCG/ACT inhaler Inhale 2 puffs into the lungs every 4 (four) hours as needed for wheezing or shortness of breath. 1 Inhaler 2   atorvastatin (LIPITOR) 10 MG tablet Take 1 tablet by mouth daily.  1   calcium carbonate (OSCAL) 1500 (600 Ca) MG TABS tablet Take 1,500 mg by mouth daily.     clopidogrel  (PLAVIX ) 75 MG tablet Take 1 tablet (75 mg total) by mouth daily. 30 tablet 11   cyanocobalamin 500 MCG tablet Take 500 mcg by mouth 2 (two) times daily.      donepezil  (ARICEPT ) 10 MG tablet TAKE 1 TABLET BY MOUTH AT  BEDTIME 30 tablet 3   famotidine  (PEPCID ) 20 MG tablet One at bedtime 30 tablet 2   ferrous sulfate 325 (65 FE) MG EC tablet Take 325 mg by mouth.     lamoTRIgine  (LAMICTAL ) 100 MG tablet Take 200 mg by mouth daily.     Melatonin 3 MG TABS Take 3 mg by mouth at bedtime.     memantine  (NAMENDA ) 5 MG tablet Take 1 tablet (5 mg total) by mouth 2 (two) times daily. 60 tablet 11   mirtazapine (REMERON) 7.5 MG tablet Take 15 mg by mouth at bedtime.     montelukast (SINGULAIR) 10 MG tablet Take 10 mg by mouth daily.     Multiple Vitamin (MULTIVITAMIN) capsule Take 1 capsule by mouth daily.     OLANZapine (ZYPREXA) 20 MG tablet Take 20 mg by mouth 2 (two) times daily. (Patient taking differently: Take 20 mg by mouth daily.)     pantoprazole  (PROTONIX ) 40 MG tablet Take 1 tablet (40 mg total) by mouth daily. Take 30-60 min before first meal of the day 30 tablet 2   No facility-administered medications prior to visit.    PAST MEDICAL HISTORY: Past Medical History:  Diagnosis Date   Anemia    Asthma    Bipolar 1 disorder (HCC)    Constipation    COPD (chronic obstructive pulmonary disease) (HCC)    Dementia (HCC)    Diabetes (HCC)    DJD (degenerative joint disease)    GERD (gastroesophageal reflux disease)    Headache, new daily persistent ([facility]) 04/2020   Infection 2020   stomach infection   Insomnia    Overdose 08/2018   per daughter, hospitalized at [facility]. unsure if intentional.   Transient cerebral ischemia     PAST SURGICAL HISTORY: Past Surgical History:  Procedure Laterality Date   ABDOMINAL HYSTERECTOMY  1999   HIP SURGERY     SI joint surgery 06-2021 (3 titanium rods)   WISDOM TOOTH EXTRACTION      FAMILY HISTORY: Family History  Problem Relation Age of Onset   Lymphoma Mother    Hypertension Mother    Stroke Mother    Dementia Father    Hypertension Sister    Diabetes  Sister    Sleep apnea Maternal Aunt    Diabetes Daughter     SOCIAL HISTORY: Social  History   Socioeconomic History   Marital status: Divorced    Spouse name: Not on file   Number of children: 1   Years of education: 10   Highest education level: Not on file  Occupational History    Comment: [employer]  Tobacco Use   Smoking status: Former    Current packs/day: 0.00    Average packs/day: 2.0 packs/day for 46.0 years (92.0 ttl pk-yrs)    Types: Cigarettes    Start date: 04/04/1970    Quit date: 04/04/2016    Years since quitting: 8.4   Smokeless tobacco: Never  Vaping Use   Vaping status: Never Used  Substance and Sexual Activity   Alcohol use: No   Drug use: No    Comment: hx cocaine, marijuana    Sexual activity: Not on file  Other Topics Concern   Not on file  Social History Narrative   Lives alone   Sister passed in September 2024   Caffeine: diet coke, 80-96 oz per day    Right handed   Social Drivers of Health   Tobacco Use: Medium Risk (08/27/2024)   Patient History    Smoking Tobacco Use: Former    Smokeless Tobacco Use: Never    Passive Exposure: Not on Actuary Strain: Not on file  Food Insecurity: Low Risk (08/05/2024)   Received from [facility]   Epic    Within the past 12 months, you worried that your food would run out before you got money to buy more: Never true    Within the past 12 months, the food you bought just didn't last and you didn't have money to get more. : Never true  Transportation Needs: No Transportation Needs (08/05/2024)   Received from [facility]    In the past 12 months, has lack of reliable transportation kept you from medical appointments, meetings, work or from getting things needed for daily living? : No  Physical Activity: Not on file  Stress: Not on file  Social Connections: Not on file  Intimate Partner Violence: Not At Risk (07/30/2024)   Received from [facility]   HITS    Over the last 12 months how often did your partner physically hurt you?: Never    Over the  last 12 months how often did your partner insult you or talk down to you?: Never    Over the last 12 months how often did your partner threaten you with physical harm?: Never    Over the last 12 months how often did your partner scream or curse at you?: Never  Depression (PHQ2-9): Not on file  Alcohol Screen: Not on file  Housing: Low Risk (08/05/2024)   Received from [facility]   Epic    What is your living situation today?: I have a steady place to live    Think about the place you live. Do you have problems with any of the following? Choose all that apply:: None/None on this list  Utilities: Low Risk (08/05/2024)   Received from [facility]   Utilities    In the past 12 months has the electric, gas, oil, or water company threatened to shut off services in your home? : No  Health Literacy: Not on file      PHYSICAL EXAM  Vitals:   08/27/24 0846  BP: 136/89  Pulse: 79  Weight: 186 lb (84.4 kg)  Height: 5' 3 (1.6 m)    Body mass index is 32.95 kg/m.     08/27/2024    8:47 AM 11/15/2023   11:27 AM 07/24/2023    3:01 PM  MMSE - Mini Mental State Exam  Orientation to time 5 4 4   Orientation to Place 4 4 4   Registration 3 3 3   Attention/ Calculation 4 5 4   Recall 2 3 2   Language- name 2 objects 2 2 2   Language- repeat 1 1 1   Language- follow 3 step command 3 3 3   Language- read & follow direction 1 1 1   Write a sentence 1 1 1   Copy design 1 1 1   Total score 27 28 26      Generalized: Well developed, in no acute distress   Neurological examination  Mentation: Alert. Follows all commands speech and language fluent Cranial nerve II-XII: Pupils were equal round reactive to light. Extraocular movements were full, visual field were full on confrontational test with exception of left eye- deviates to the left with superior gaze. Facial sensation and strength were normal. Uvula tongue midline. Head turning and shoulder shrug  were normal and symmetric.   Motor: The  motor testing reveals 5 over 5 strength of all 4 extremities. Good symmetric motor tone is noted throughout.   Sensory: Sensory testing is intact to soft touch on all 4 extremities. No evidence of extinction is noted.  Coordination: Cerebellar testing reveals good finger-nose-finger and heel-to-shin bilaterally.  Gait and station: Gait is normal.    DIAGNOSTIC DATA (LABS, IMAGING, TESTING) - I reviewed patient records, labs, notes, testing and imaging myself where available.  Lab Results  Component Value Date   WBC 8.5 05/14/2020   HGB 13.4 05/14/2020   HCT 38.7 05/14/2020   MCV 109 (H) 05/14/2020   PLT 305 05/14/2020      Component Value Date/Time   NA 134 05/19/2020 1010   K 4.8 05/19/2020 1010   CL 96 05/19/2020 1010   CO2 21 05/19/2020 1010   GLUCOSE 139 (H) 05/19/2020 1010   BUN 12 05/19/2020 1010   CREATININE 0.77 05/19/2020 1010   CALCIUM 11.6 (H) 05/19/2020 1010   PROT 6.7 05/14/2020 1414   ALBUMIN 4.6 05/14/2020 1414   AST 21 05/14/2020 1414   ALT 23 05/14/2020 1414   ALKPHOS 75 05/14/2020 1414   BILITOT 0.2 05/14/2020 1414   GFRNONAA 83 05/19/2020 1010   GFRAA 96 05/19/2020 1010   Lab Results  Component Value Date   VITAMINB12 1,037 05/19/2020   Lab Results  Component Value Date   TSH 2.680 05/14/2020      ASSESSMENT AND PLAN 69 y.o. year old female  has a past medical history of Anemia, Asthma, Bipolar 1 disorder (HCC), Constipation, COPD (chronic obstructive pulmonary disease) (HCC), Dementia (HCC), Diabetes (HCC), DJD (degenerative joint disease), GERD (gastroesophageal reflux disease), Headache, new daily persistent ([facility]) (04/2020), Infection (2020), Insomnia, Overdose (08/2018), and Transient cerebral ischemia. here with:   Lacunar Infarct left frontal corona radiata.    Continue clopidogrel  75 mg daily  for secondary stroke prevention.   Discussed secondary stroke prevention measures and importance of close PCP follow up for aggressive stroke  risk factor management. I have gone over the pathophysiology of stroke, warning signs and symptoms, risk factors and their management in some detail with instructions to go to the closest emergency room for symptoms of concern. HTN: BP goal <130/90.  Stable HLD: LDL  goal <70. Recent LDL 71.  DMII: A1c goal<7.0. Recent A1c 5.8.  Encouraged patient to monitor diet and encouraged exercise   2. Memory disturbance  MMSE 27/30- stable Continue Aricept  10 mg daily at bedtime Continue Namenda  10 mg BID  FU in 8-9 months or sooner if needed.   Meds ordered this encounter  Medications   clopidogrel  (PLAVIX ) 75 MG tablet    Sig: Take 1 tablet (75 mg total) by mouth daily.    Dispense:  90 tablet    Refill:  3    Supervising Provider:   YAN, YIJUN [[id number]]   donepezil  (ARICEPT ) 10 MG tablet    Sig: Take 1 tablet (10 mg total) by mouth at bedtime.    Dispense:  90 tablet    Refill:  3    Supervising Provider:   YAN, YIJUN [[id number]]   memantine  (NAMENDA ) 5 MG tablet    Sig: Take 1 tablet (5 mg total) by mouth 2 (two) times daily.    Dispense:  180 tablet    Refill:  3    Supervising Provider:   YAN, YIJUN [[id number]]      Duwaine Russell, MSN, NP-C 08/27/2024, 8:55 AM [facility] [address] [place], [place] [address] [phone]  Reviewed and agree with plan Onetha Epp, MD "

## 2025-05-22 ENCOUNTER — Ambulatory Visit: Admitting: Adult Health

## 2025-05-27 ENCOUNTER — Ambulatory Visit: Admitting: Adult Health
